# Patient Record
Sex: Female | Born: 1955
Health system: Southern US, Community
[De-identification: ages and names within clinical notes are randomized; demographics above are authoritative.]

## PROBLEM LIST (undated history)

## (undated) DIAGNOSIS — N920 Excessive and frequent menstruation with regular cycle: Secondary | ICD-10-CM

## (undated) DIAGNOSIS — K317 Polyp of stomach and duodenum: Secondary | ICD-10-CM

## (undated) DIAGNOSIS — F32A Depression, unspecified: Secondary | ICD-10-CM

## (undated) DIAGNOSIS — K219 Gastro-esophageal reflux disease without esophagitis: Secondary | ICD-10-CM

## (undated) DIAGNOSIS — N840 Polyp of corpus uteri: Secondary | ICD-10-CM

## (undated) DIAGNOSIS — D219 Benign neoplasm of connective and other soft tissue, unspecified: Secondary | ICD-10-CM

## (undated) DIAGNOSIS — K148 Other diseases of tongue: Secondary | ICD-10-CM

## (undated) DIAGNOSIS — N841 Polyp of cervix uteri: Secondary | ICD-10-CM

## (undated) DIAGNOSIS — D689 Coagulation defect, unspecified: Secondary | ICD-10-CM

## (undated) DIAGNOSIS — F329 Major depressive disorder, single episode, unspecified: Secondary | ICD-10-CM

## (undated) DIAGNOSIS — N809 Endometriosis, unspecified: Secondary | ICD-10-CM

## (undated) DIAGNOSIS — N63 Unspecified lump in unspecified breast: Secondary | ICD-10-CM

## (undated) DIAGNOSIS — D131 Benign neoplasm of stomach: Secondary | ICD-10-CM

## (undated) HISTORY — PX: BREAST BIOPSY: SHX20

## (undated) HISTORY — PX: DILATION AND CURETTAGE OF UTERUS: SHX78

## (undated) HISTORY — DX: Polyp of stomach and duodenum: K31.7

## (undated) HISTORY — DX: Depression, unspecified: F32.A

## (undated) HISTORY — DX: Excessive and frequent menstruation with regular cycle: N92.0

## (undated) HISTORY — DX: Major depressive disorder, single episode, unspecified: F32.9

## (undated) HISTORY — DX: Unspecified lump in unspecified breast: N63.0

## (undated) HISTORY — DX: Gastro-esophageal reflux disease without esophagitis: K21.9

## (undated) HISTORY — DX: Polyp of corpus uteri: N84.0

## (undated) HISTORY — DX: Endometriosis, unspecified: N80.9

## (undated) HISTORY — DX: Benign neoplasm of connective and other soft tissue, unspecified: D21.9

## (undated) HISTORY — DX: Benign neoplasm of stomach: D13.1

## (undated) HISTORY — PX: TUBAL LIGATION: SHX77

## (undated) HISTORY — DX: Coagulation defect, unspecified: D68.9

## (undated) HISTORY — PX: HYSTEROSCOPY: SHX211

## (undated) HISTORY — DX: Polyp of cervix uteri: N84.1

---
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
## 1999-05-05 ENCOUNTER — Other Ambulatory Visit: Admission: RE | Admit: 1999-05-05 | Discharge: 1999-05-05 | Payer: Self-pay | Admitting: Obstetrics and Gynecology

## 1999-09-13 HISTORY — PX: VAGINAL HYSTERECTOMY: SUR661

## 1999-11-25 ENCOUNTER — Other Ambulatory Visit: Admission: RE | Admit: 1999-11-25 | Discharge: 1999-11-25 | Payer: Self-pay | Admitting: Obstetrics and Gynecology

## 2000-01-31 ENCOUNTER — Encounter (INDEPENDENT_AMBULATORY_CARE_PROVIDER_SITE_OTHER): Payer: Self-pay

## 2000-01-31 ENCOUNTER — Inpatient Hospital Stay (HOSPITAL_COMMUNITY): Admission: RE | Admit: 2000-01-31 | Discharge: 2000-02-02 | Payer: Self-pay | Admitting: Obstetrics and Gynecology

## 2000-08-10 ENCOUNTER — Other Ambulatory Visit: Admission: RE | Admit: 2000-08-10 | Discharge: 2000-08-10 | Payer: Self-pay | Admitting: Radiology

## 2002-07-01 ENCOUNTER — Other Ambulatory Visit: Admission: RE | Admit: 2002-07-01 | Discharge: 2002-07-01 | Payer: Self-pay | Admitting: Obstetrics and Gynecology

## 2002-12-18 ENCOUNTER — Encounter: Admission: RE | Admit: 2002-12-18 | Discharge: 2003-03-18 | Payer: Self-pay | Admitting: Family Medicine

## 2004-09-12 HISTORY — PX: OTHER SURGICAL HISTORY: SHX169

## 2005-03-02 ENCOUNTER — Other Ambulatory Visit: Admission: RE | Admit: 2005-03-02 | Discharge: 2005-03-02 | Payer: Self-pay | Admitting: Obstetrics and Gynecology

## 2005-08-10 ENCOUNTER — Ambulatory Visit: Payer: Self-pay | Admitting: Otolaryngology

## 2006-11-16 ENCOUNTER — Other Ambulatory Visit: Admission: RE | Admit: 2006-11-16 | Discharge: 2006-11-16 | Payer: Self-pay | Admitting: Obstetrics and Gynecology

## 2007-11-21 ENCOUNTER — Other Ambulatory Visit: Admission: RE | Admit: 2007-11-21 | Discharge: 2007-11-21 | Payer: Self-pay | Admitting: Obstetrics and Gynecology

## 2008-01-21 ENCOUNTER — Ambulatory Visit: Payer: Self-pay | Admitting: Family Medicine

## 2008-12-18 ENCOUNTER — Encounter: Payer: Self-pay | Admitting: Obstetrics and Gynecology

## 2008-12-18 ENCOUNTER — Other Ambulatory Visit: Admission: RE | Admit: 2008-12-18 | Discharge: 2008-12-18 | Payer: Self-pay | Admitting: Obstetrics and Gynecology

## 2008-12-18 ENCOUNTER — Ambulatory Visit: Payer: Self-pay | Admitting: Obstetrics and Gynecology

## 2010-02-04 ENCOUNTER — Other Ambulatory Visit: Admission: RE | Admit: 2010-02-04 | Discharge: 2010-02-04 | Payer: Self-pay | Admitting: Obstetrics and Gynecology

## 2010-02-04 ENCOUNTER — Ambulatory Visit: Payer: Self-pay | Admitting: Obstetrics and Gynecology

## 2010-03-23 ENCOUNTER — Encounter: Admission: RE | Admit: 2010-03-23 | Discharge: 2010-03-23 | Payer: Self-pay | Admitting: Obstetrics and Gynecology

## 2010-09-17 LAB — HM PAP SMEAR: HM Pap smear: NORMAL

## 2011-01-28 NOTE — H&P (Signed)
[facility]  Patient:    Lauren Callahan, Lauren Callahan                         MRN: [id number] Adm. Date:  01/31/00 Attending:  Rande Brunt. Eda Paschal, M.D.                         History and Physical  CHIEF COMPLAINT:  Dysmenorrhea, menorrhagia, pelvic pain.  HISTORY OF PRESENT ILLNESS:  The patient is a 55 year old, gravida 1, para 1, AB 0, who presented to the office in March, with the persistence of severe dysmenorrhea, menorrhagia, spotting for 12 days.  She has had a persistent left adnexal mass which we have been following on ultrasound, which looks consistent with endometriosis of the left ovary.  She also gets intermittent pelvic pain between her periods.  The problems that are bothering her the most are the heavy periods, pain with her periods, and the intermittent pain between her periods.  She does have small myomas that have also probably contributed to the above.  Previous medical therapy included a D&C which only gave her relief for a short period of time, and oral contraceptives where she has had no relief at all.  As a result of the persistence of her very difficult period, she now enters the hospital for definitive surgery. Definitive surgery is removal of the uterus, but because of the persistent left adnexal mass, which is complex, this needs to be addressed at this time as well.  We have discussed options of addressing it either through a TAH or through a laparoscopic approach, and she has elected to do it laparoscopically to try to save her from having an incision.  We will laparoscope her.  If it is appropriate to proceed laparoscopically combined with a vaginal hysterectomy, we will do so.  If not, she understands the need for laparotomy. She also understands the risk that this could be malignant and the issues involved with that.  She would like me to preserve one of her ovaries because of the issues of hormone replacement, and assuming there is no  malignancy we will do so.  If we do not want to spill the contents of the left ovarian cyst, we may do a left salpingo-oophorectomy or we may just do an ovarian cystectomy on the left pending the above.  PAST MEDICAL HISTORY:  Previous D&C and laparoscopic tubal ligation done in November 1997.  She was also hysteroscoped at the same time.  PRESENT MEDICATIONS:  Nonsteroidal anti-inflammatory drugs.  ALLERGIES:  None.  FAMILY HISTORY:  Father and grandfather are diabetic.  Father is hypertensive, as well as having coronary artery disease.  SOCIAL HISTORY:  She is a user of alcohol socially, but does not smoke.  She does use caffeine.  REVIEW OF SYSTEMS:  HEENT:  Basically negative.  Cardiovascular:  Shortness of breath at rest or exertion.  Respiratory:  Negative.  GI:  History of GERD. GU is negative.  Neuromuscular is negative except for neck stiffness. Endocrine is negative.  PHYSICAL EXAMINATION:  GENERAL:  The patient is a well-developed, well-nourished female in no acute distress.  VITAL SIGNS:  Blood pressure 120/74, pulse 80 and regular, respirations 16 and nonlabored.  She is afebrile.  HEENT:  All within normal limits.  NECK:  Supple.  Trachea in the midline.  Thyroid is not enlarged.  LUNGS:  Clear to P&A.  HEART:  No thrills, heaves, or murmurs.  BREASTS:  No masses.  ABDOMEN:  Soft without guarding, rebound, or masses.  PELVIC:  External and vaginal is within normal limits.  Cervix is clean.  Pap was obtained.  Uterus is enlarged by myomas to 7-8 week size.  Adnexa are palpably normal.  RECTAL:  Negative.  EXTREMITIES:  Within normal limits.  ADMISSION IMPRESSION:  Severe dysmenorrhea, menorrhagia with spotting inbetween.  Pelvic pain.  Persistent left adnexal mass on ultrasound. DD:  01/31/00 TD:  01/31/00 Job: [id number]

## 2011-01-28 NOTE — Op Note (Signed)
[facility]  Patient:    Lauren Callahan, Lauren Callahan                    MRN: [id number] Proc. Date: 01/31/00 Adm. Date:  [id number] Disc. Date: [id number] Attending:  Sharon Mt                           Operative Report  PREOPERATIVE DIAGNOSIS:  Pelvic pain, dysfunctional uterine bleeding, dysmenorrhea, fibroids, left adnexal mass.  POSTOPERATIVE DIAGNOSIS:  Pelvic pain, dysfunctional uterine bleeding, dysmenorrhea, fibroids, left adnexal mass, endometrioma of left fallopian tube and endometriosis of right fallopian tube.  OPERATION PERFORMED:  Laparoscopy with partial left salpingectomy, fulguration of endometriosis of right fallopian tube followed by laparoscopically assisted vaginal hysterectomy.  SURGEON:  Daniel L. Eda Paschal, M.D.  ASSISTANT:  Raynald Kemp, M.D.  ANESTHESIA:  OPERATIVE FINDINGS:  At time of laparoscopy, the patients uterus was enlarged by multiple small myomas.  The left fallopian tube showed an endometrioma in the midportion of the left fallopian tube of about 2 to 3 cm.  The left ovary itself; however, was free of disease.  The right ovary and fallopian tube were completely free of disease except for several endometrial implants on the serosa of the fallopian tube.  The ileocecal junction was identified and the appendix was normal.  Cul-de-sac was free of endometriosis.  There was some serosal endometriosis involving the posterior serosa of the uterus.  DESCRIPTION OF PROCEDURE:  After adequate general endotracheal anesthesia, the patient was placed in dorsal lithotomy position and prepped and draped in the usual sterile manner.  A Foley catheter was inserted in the patients bladder. A Hulka tenaculum was inserted into the uterus.  A pneumoperitoneum was created with the ____________ needle.  Following this, three trocars were placed.  The first was subumbilical with a 10 mm trocar.  Through this, the operating  laparoscope and a camera were placed.  Then two 5 mm ports were placed superpubically, one in the midline, one in the left lower quadrant. First, peritoneal washings were obtained and the pelvis was systematically evaluated.  The left adnexal mass that had been seen preoperatively turned out to be an entometrioma of the left fallopian tube.  After this was ascertained, it was removed using bipolar and scissors to excise it without any bleeding. The area of endometriois in the right fallopian tube was also fulgurated until it was completely gone.  After this, the attachments of the uterus to the adnexa were separated including the utero-ovarian ligament, the round ligament and the fallopian tube with bipolar and cutting, the bladder flap was started. At this point it was felt that the surgery could proceed vaginally.  The patient was repositioned.  A 1:200,000 solution of epinephrine and 0.5% Xylocaine was injected around the cervix.  A 360 degree incision was made around the cervix.  The bladder was mobilized superiorly as was the posterior peritoneum.  The posterior peritoneum and vesicouterine  ____________ peritoneum were entered by sharp dissection.  The uterosacral ligaments were clamped, cut and suture ligated with #1 chromic catgut shortening them and suturing them to the vaginal vault laterally for good support.  Cardinal ligaments were clamped, cut and suture ligated with #1 chromic catgut.  The uterine arteries were clamped, cut and doubly suture ligated with #1 chromic catgut.  The remaining portion of broad ligament which was very limited was clamped, cut and suture ligated with #1 chromic catgut.  The uterus was delivered intact and sent to pathology for tissue diagnosis.  The endometrioma of the tube could not be located at this point.  It had been placed in the cul-de-sac but had moved.  Therefore, the procedure was terminated in the following fashion.  A 0 Vicryl suture was  used to whipstitch the posterior peritoneum to the vaginal cuff 360 degrees around for good hemostasis.  A 2-0 Vicryl modified McCall enterocele suture was placed to eliminate any possible future enterocele.  The vaginal cuff was closed with figure-of-eights of #1 chromic catgut.  The surgeons regloved and went abdominally again.  The pneumoperitoneum was recreated.  The pelvis was copiously irrigated with Ringers lactate and there was no bleeding from any of the pedicles.  The endometrium on the tube was found and was removed through the operating channel.  The pneumoperitoneum was then evacuated.  The subumbilical fascial incision was closed with 0 Vicryl and all skin incisions were closed with 3-0 Monocryl.  Estimated blood loss for this entire procedure was 150 cc with none replaced.  The patient tolerated the procedure well and left the operating room in satisfactory condition draining clear urine from a Foley catheter. DD:  01/31/00 TD:  02/04/00 Job: [id number]

## 2011-01-28 NOTE — Discharge Summary (Signed)
[facility]  Patient:    Lauren Callahan, Lauren Callahan                    MRN: [id number] Adm. Date:  [id number] Disc. Date: [id number] Attending:  Sharon Mt                           Discharge Summary  HISTORY OF PRESENT ILLNESS:  The patient is a 55 year old female who entered the hospital with pelvic pain, dysfunctional uterine bleeding, dysmenorrhea, and fibroids with endometriosis expected, for definitive surgery.  HOSPITAL COURSE:  On the day of admission, she was taken to the operating room.  She was first laparoscoped.  Findings were endometriosis involving her fallopian tubes.  She underwent laparoscopic partial salpingectomy on the left and fulguration of endometriosis on right fallopian tube.  This was followed by laparoscopically assisted hysterectomy.  Postoperatively, she had a slight ileus which responded to Dulcolax suppository and IV fluids.  By the second postoperative day, she was ready for discharge.  DISCHARGE MEDICATIONS: 1. Darvocet-N 100 for pain relief. 2. Dulcolax suppositories and laxatives p.r.n. for ileus.  ACTIVITY:  Regular.  DIET:  Soft to advance as tolerated.  WOUND CARE:  Routine.  FOLLOW-UP:  She is to return to our office in three weeks for further follow-up.  LABORATORY DATA:  The final pathology report revealed tubal endometriosis, uterus with cervical endometriosis, and multiple leiomyoma uteri.  CONDITION ON DISCHARGE:  Improved.  DISCHARGE DIAGNOSES: 1. Dysfunctional uterine bleeding. 2. Dysmenorrhea. 3. Pelvic pain. 4. Endometriosis. 5. Leiomyoma uteri.  OPERATIONS: 1. Laparoscopic partial salpingectomy. 2. Fulguration of endometriosis. 3. Laparoscopically-assisted vaginal hysterectomy. DD:  02/02/00 TD:  02/05/00 Job: [id number]

## 2011-03-09 ENCOUNTER — Encounter (INDEPENDENT_AMBULATORY_CARE_PROVIDER_SITE_OTHER): Payer: BC Managed Care – PPO | Admitting: Obstetrics and Gynecology

## 2011-03-09 ENCOUNTER — Other Ambulatory Visit: Payer: Self-pay | Admitting: Obstetrics and Gynecology

## 2011-03-09 ENCOUNTER — Other Ambulatory Visit (HOSPITAL_COMMUNITY)
Admission: RE | Admit: 2011-03-09 | Discharge: 2011-03-09 | Disposition: A | Discharge status: Home / Self Care | Payer: BC Managed Care – PPO | Source: Ambulatory Visit | Attending: Obstetrics and Gynecology | Admitting: Obstetrics and Gynecology

## 2011-03-09 DIAGNOSIS — Z833 Family history of diabetes mellitus: Secondary | ICD-10-CM

## 2011-03-09 DIAGNOSIS — J029 Acute pharyngitis, unspecified: Secondary | ICD-10-CM

## 2011-03-09 DIAGNOSIS — Z13220 Encounter for screening for lipoid disorders: Secondary | ICD-10-CM

## 2011-03-09 DIAGNOSIS — Z01419 Encounter for gynecological examination (general) (routine) without abnormal findings: Secondary | ICD-10-CM

## 2011-03-09 DIAGNOSIS — Z124 Encounter for screening for malignant neoplasm of cervix: Secondary | ICD-10-CM | POA: Insufficient documentation

## 2011-05-20 ENCOUNTER — Telehealth: Payer: Self-pay

## 2011-05-20 DIAGNOSIS — N390 Urinary tract infection, site not specified: Secondary | ICD-10-CM

## 2011-05-20 MED ORDER — CIPROFLOXACIN HCL 250 MG PO TABS: 250.0000 mg | ORAL_TABLET | Freq: Two times a day (BID) | ORAL | Status: AC

## 2011-05-20 NOTE — Telephone Encounter (Signed)
PT REQUESTING AN ANTIBIOTIC FOR URINARY PRESSURE & RX FOR URINARY SPASMS. STATES YOU HAVE GIVEN RX TO HER BEFORE. I DO NOT SEE IT IN HER CHART. SHE ALSO STATES SHE HAD POSSIBLE "HEART PROBLEMS" WITH THE PREVIOUS ANTIBIOTIC. I TRANSFERRED HER UP TO APPTS. TO BE SEEN DUE TO THIS POSSIBLE LIFE-THREATENING REACTION TO PREVIOUS ANTIBIOTIC. DONNA SENT INFO. BACK TO MY VOICEMAIL THAT PT IS  INSISTING TO ASK YOU FOR RX'S WITHOUT BEING SEEN.

## 2011-05-20 NOTE — Telephone Encounter (Signed)
PT. NOTIFIED OF DR. G'S NOTE BELOW. PT. STATES SHE THINKS IT WAS THE MACRODANTIN SHE HAD THE SEVERE REACTION TO. ORDER IN P.C. RX SENT TO HER PHARMACY.

## 2011-05-20 NOTE — Telephone Encounter (Signed)
If not allergic treat her with Cipro 250 mg twice a day for 7 days. Patient must have followup UA done. Was the antibiotic that she had a reaction to previously Macrodantin?

## 2011-11-09 ENCOUNTER — Other Ambulatory Visit: Payer: Self-pay | Admitting: *Deleted

## 2011-11-09 MED ORDER — ALPRAZOLAM 0.5 MG PO TABS: 0.5000 mg | ORAL_TABLET | Freq: Every evening | ORAL | Status: AC | PRN

## 2011-11-09 NOTE — Telephone Encounter (Signed)
rx called in

## 2012-04-03 ENCOUNTER — Encounter: Payer: Self-pay | Admitting: Obstetrics and Gynecology

## 2012-06-20 ENCOUNTER — Other Ambulatory Visit: Payer: Self-pay | Admitting: Obstetrics and Gynecology

## 2012-06-20 NOTE — Telephone Encounter (Signed)
Patient is overdue for CE since June 2012.  We will contact her to try and schedule.

## 2012-06-20 NOTE — Telephone Encounter (Signed)
rx called into pharmacy

## 2012-08-28 ENCOUNTER — Telehealth: Payer: Self-pay | Admitting: *Deleted

## 2012-08-28 ENCOUNTER — Encounter: Payer: Self-pay | Admitting: Gynecology

## 2012-08-28 DIAGNOSIS — N841 Polyp of cervix uteri: Secondary | ICD-10-CM | POA: Insufficient documentation

## 2012-08-28 DIAGNOSIS — N920 Excessive and frequent menstruation with regular cycle: Secondary | ICD-10-CM | POA: Insufficient documentation

## 2012-08-28 DIAGNOSIS — N809 Endometriosis, unspecified: Secondary | ICD-10-CM | POA: Insufficient documentation

## 2012-08-28 DIAGNOSIS — N63 Unspecified lump in unspecified breast: Secondary | ICD-10-CM | POA: Insufficient documentation

## 2012-08-28 DIAGNOSIS — D219 Benign neoplasm of connective and other soft tissue, unspecified: Secondary | ICD-10-CM | POA: Insufficient documentation

## 2012-08-28 DIAGNOSIS — N840 Polyp of corpus uteri: Secondary | ICD-10-CM | POA: Insufficient documentation

## 2012-08-28 MED ORDER — NITROFURANTOIN MONOHYD MACRO 100 MG PO CAPS: ORAL_CAPSULE | ORAL | Status: DC

## 2012-08-28 NOTE — Telephone Encounter (Signed)
Pt calling c/o uti since Saturday, c/o burning with urination and pressure. Pt declined OV today, asked if you could just give her RX because she is sick and lives in [place]. Pt was given cirpro on telephone encounter 05/20/11. Pt has annual scheduled on 09/06/12. Please advise

## 2012-08-28 NOTE — Telephone Encounter (Signed)
Macrobid twice a day with food for 7 days.

## 2012-08-28 NOTE — Addendum Note (Signed)
Addended by: Aura Camps on: 08/28/2012 11:09 AM   Modules accepted: Orders

## 2012-08-28 NOTE — Telephone Encounter (Signed)
Left message on pt voicemail with below, rx sent

## 2012-09-06 ENCOUNTER — Encounter: Payer: BC Managed Care – PPO | Admitting: Obstetrics and Gynecology

## 2013-01-09 ENCOUNTER — Encounter: Payer: Self-pay | Admitting: Women's Health

## 2013-01-09 ENCOUNTER — Ambulatory Visit (INDEPENDENT_AMBULATORY_CARE_PROVIDER_SITE_OTHER): Payer: BC Managed Care – PPO | Admitting: Women's Health

## 2013-01-09 VITALS — BP 116/84 | Ht 65.0 in | Wt 190.0 lb

## 2013-01-09 DIAGNOSIS — Z01419 Encounter for gynecological examination (general) (routine) without abnormal findings: Secondary | ICD-10-CM

## 2013-01-09 DIAGNOSIS — F32A Depression, unspecified: Secondary | ICD-10-CM

## 2013-01-09 DIAGNOSIS — Z13220 Encounter for screening for lipoid disorders: Secondary | ICD-10-CM

## 2013-01-09 DIAGNOSIS — Z833 Family history of diabetes mellitus: Secondary | ICD-10-CM

## 2013-01-09 DIAGNOSIS — F329 Major depressive disorder, single episode, unspecified: Secondary | ICD-10-CM

## 2013-01-09 LAB — COMPREHENSIVE METABOLIC PANEL
ALT: 29 U/L (ref 0–35)
AST: 24 U/L (ref 0–37)
Albumin: 4.3 g/dL (ref 3.5–5.2)
BUN: 9 mg/dL (ref 6–23)
Calcium: 9.5 mg/dL (ref 8.4–10.5)
Creat: 0.8 mg/dL (ref 0.50–1.10)
Glucose, Bld: 85 mg/dL (ref 70–99)
Sodium: 141 mEq/L (ref 135–145)
Total Protein: 6.7 g/dL (ref 6.0–8.3)

## 2013-01-09 LAB — LIPID PANEL
Cholesterol: 197 mg/dL (ref 0–200)
Total CHOL/HDL Ratio: 3.6 Ratio
Triglycerides: 103 mg/dL (ref <150)

## 2013-01-09 LAB — CBC WITH DIFFERENTIAL/PLATELET
Basophils Absolute: 0.1 10*3/uL (ref 0.0–0.1)
Eosinophils Relative: 2 % (ref 0–5)
Hemoglobin: 14.8 g/dL (ref 12.0–15.0)
Lymphs Abs: 2.4 10*3/uL (ref 0.7–4.0)
MCHC: 33.6 g/dL (ref 30.0–36.0)
MCV: 88.2 fL (ref 78.0–100.0)
Monocytes Relative: 8 % (ref 3–12)
Neutro Abs: 4.5 10*3/uL (ref 1.7–7.7)
Platelets: 332 10*3/uL (ref 150–400)
RBC: 4.99 MIL/uL (ref 3.87–5.11)
WBC: 7.7 10*3/uL (ref 4.0–10.5)

## 2013-01-09 LAB — TSH: TSH: 2.532 u[IU]/mL (ref 0.350–4.500)

## 2013-01-09 MED ORDER — ALPRAZOLAM 0.5 MG PO TABS: ORAL_TABLET | ORAL | Status: DC

## 2013-01-09 MED ORDER — BUPROPION HCL ER (XL) 150 MG PO TB24: 150.0000 mg | ORAL_TABLET | Freq: Every day | ORAL | Status: DC

## 2013-01-09 NOTE — Progress Notes (Signed)
Lauren Callahan Jul 23, 1956 [id number]    History:  The patient presents for annual exam. Frustration with weight, trouble with healthy eating and exercise although she is aware of what changes to make. Feeling depressed and not getting enjoyment out of things that are usually enjoyable to her. Has had extensive counseling in the past, denies need for further counseling, states life is good. Has never taken in antidepressant other than occasional Xanax. She also complains of joint pain and mild swelling in posterior ankles upon waking in morning. TVH with [facility] for fibroids. History of normal Paps and mammograms. DEXA 03/2011 osteopenia T score -1.1 left femoral neck FRAX 6.1%/0.3%. Had improvement in the lumbar spine from DEXA done in 2010. Negative stomach polyp and has followup scheduled next week. Negative colonoscopy 2012.   Past medical history, past surgical history, family history and social history were all reviewed and documented in the EPIC chart. Retired. One biological son (age 27) and two stepchildren (age 45 and 57), all doing okay currently 1 and nursing school, one had drug rehabilitation in the past. Parents diabetics, Father heart disease and HTN.   ROS:  A  ROS was performed and pertinent positives and negatives are included in the history.  Exam:  Filed Vitals:   01/09/13 0956  BP: 116/84    General appearance:  Normal Head/Neck:  Normal, without cervical or supraclavicular adenopathy. Thyroid:  Symmetrical, normal in size, without palpable masses or nodularity. Respiratory  Effort:  Normal  Auscultation:  Clear without wheezing or rhonchi Cardiovascular  Auscultation:  Regular rate, without rubs, murmurs or gallops  Edema/varicosities:  Not grossly evident Abdominal  Soft,nontender, without masses, guarding or rebound.  Liver/spleen:  No organomegaly noted  Hernia:  None appreciated  Skin  Inspection:  Grossly normal  Palpation:  Grossly  normal Neurologic/psychiatric  Orientation:  Normal with appropriate conversation.  Mood/affect:  Normal  Genitourinary    Breasts: Examined lying and sitting.     Right: Without masses, retractions, discharge or axillary adenopathy.     Left: Without masses, retractions, discharge or axillary adenopathy.   Inguinal/mons:  Normal without inguinal adenopathy  External genitalia:  Normal  BUS/Urethra/Skene's glands:  Normal  Bladder:  Normal  Vagina:  Normal  Cervix:  absent Uterus: absent  Adnexa/parametria:     Rt: Without masses or tenderness.   Lt: Without masses or tenderness.  Anus and perineum: Normal  Digital rectal exam: Normal sphincter tone without palpated masses or tenderness  Assessment/Plan:  57 y.o. MWF G1P1 and 2 stepchildren  for annual exam.   Depression Overweight  Osteopenia  Plan:options for depression and reviewed, denies need for further counseling would like to try a medication, but worried about weight gain. Wellbutrin 150 XL by mouth daily/ a.m., reviewed importance of increasing regular exercise as well which will help with mood also. Instructed to call office in a few weeks to a month to report on how well the medicine is working for her. Repeat DEXA in one year. Ordered CMET, TSH, lipid panel, UA. Home Hemoccult card given with instructions.    Harrington Challenger [facility], 10:44 AM 01/09/2013

## 2013-01-09 NOTE — Patient Instructions (Addendum)

## 2013-01-10 LAB — URINALYSIS W MICROSCOPIC + REFLEX CULTURE
Nitrite: NEGATIVE
Urobilinogen, UA: 1 mg/dL (ref 0.0–1.0)

## 2013-01-11 ENCOUNTER — Encounter: Payer: Self-pay | Admitting: Obstetrics and Gynecology

## 2013-02-06 ENCOUNTER — Other Ambulatory Visit: Payer: Self-pay

## 2013-02-06 ENCOUNTER — Telehealth: Payer: Self-pay

## 2013-02-06 DIAGNOSIS — F329 Major depressive disorder, single episode, unspecified: Secondary | ICD-10-CM

## 2013-02-06 MED ORDER — BUPROPION HCL ER (XL) 150 MG PO TB24: 150.0000 mg | ORAL_TABLET | Freq: Every day | ORAL | Status: DC

## 2013-02-06 NOTE — Telephone Encounter (Signed)
I received pharmacy request for 90 day supply of  patient's Wellbutrin. Lauren Callahan had prescribed 6 refills last mos so I went ahead and okayed it.  Upon review of 4/30 office note [place] had noted that patient should call and let her know how the meds are helping after several weeks to one mos on it.  I left patient a message and asked her to call to report per [place] note.

## 2013-04-15 ENCOUNTER — Encounter: Payer: Self-pay | Admitting: Women's Health

## 2013-06-07 ENCOUNTER — Other Ambulatory Visit: Payer: Self-pay | Admitting: Women's Health

## 2013-06-10 NOTE — Telephone Encounter (Signed)
rx called in [facility] 

## 2013-06-26 ENCOUNTER — Other Ambulatory Visit: Payer: Self-pay | Admitting: Women's Health

## 2013-07-18 ENCOUNTER — Other Ambulatory Visit: Payer: Self-pay

## 2013-08-09 ENCOUNTER — Other Ambulatory Visit: Payer: Self-pay | Admitting: Women's Health

## 2013-09-17 LAB — HM COLONOSCOPY

## 2013-10-17 ENCOUNTER — Other Ambulatory Visit: Payer: Self-pay | Admitting: Women's Health

## 2013-10-18 NOTE — Telephone Encounter (Signed)
Called into pharmacy

## 2014-05-09 ENCOUNTER — Encounter: Payer: Self-pay | Admitting: Women's Health

## 2014-05-11 ENCOUNTER — Encounter: Payer: Self-pay | Admitting: Women's Health

## 2014-06-25 ENCOUNTER — Telehealth: Payer: Self-pay

## 2014-06-25 NOTE — Telephone Encounter (Signed)
Overdue for annual, please have her schedule, usually best to stay on med if feeling better but will discuss at annual exam.

## 2014-06-25 NOTE — Telephone Encounter (Signed)
Patient said she has been taking antidepressant for 1 1/2 years and is not ready to d/c it as she is feeling much better and no longer wants to be on it.  She was calling to ask how to d/c it but then mentioned she has not taken it in four days.

## 2014-06-26 NOTE — Telephone Encounter (Signed)
Left detailed message on patient's voice mail advising per [place]'s note below.

## 2014-07-14 ENCOUNTER — Encounter: Payer: Self-pay | Admitting: Women's Health

## 2014-07-18 LAB — HM MAMMOGRAPHY: HM MAMMO: NORMAL

## 2014-07-24 ENCOUNTER — Other Ambulatory Visit: Payer: Self-pay

## 2014-07-25 ENCOUNTER — Other Ambulatory Visit: Payer: Self-pay

## 2014-07-29 ENCOUNTER — Telehealth: Payer: Self-pay | Admitting: *Deleted

## 2014-07-29 MED ORDER — BUPROPION HCL ER (XL) 150 MG PO TB24: 150.0000 mg | ORAL_TABLET | Freq: Every day | ORAL | Status: DC

## 2014-07-29 NOTE — Telephone Encounter (Signed)
Pt has annual schedule on 08/26/14 requesting refill on Wellbutrin 150 XL. Okay to refill?

## 2014-07-29 NOTE — Telephone Encounter (Signed)
Ok to refill 

## 2014-07-29 NOTE — Telephone Encounter (Signed)
Rx sent, pt informed. 

## 2014-08-26 ENCOUNTER — Encounter: Payer: BC Managed Care – PPO | Admitting: Women's Health

## 2014-09-08 ENCOUNTER — Other Ambulatory Visit: Payer: Self-pay

## 2014-09-08 MED ORDER — BUPROPION HCL ER (XL) 150 MG PO TB24: 150.0000 mg | ORAL_TABLET | Freq: Every day | ORAL | Status: DC

## 2014-09-08 NOTE — Telephone Encounter (Signed)
I spoke with patient and stressed importance of keeping CE appt that is scheduled. Refill sent.

## 2014-09-08 NOTE — Telephone Encounter (Signed)
Patient overdue for CE. Last one 01/09/13.  Has CE scheduled 10/08/14.

## 2014-09-08 NOTE — Telephone Encounter (Signed)
[place] for refill for 1 month only BUT must make/keep annual exam appt.

## 2014-09-17 ENCOUNTER — Encounter: Payer: Self-pay | Admitting: Internal Medicine

## 2014-09-17 ENCOUNTER — Ambulatory Visit (INDEPENDENT_AMBULATORY_CARE_PROVIDER_SITE_OTHER): Payer: BLUE CROSS/BLUE SHIELD | Admitting: Internal Medicine

## 2014-09-17 VITALS — BP 116/72 | HR 77 | Temp 98.1°F | Ht 64.75 in | Wt 194.0 lb

## 2014-09-17 DIAGNOSIS — E669 Obesity, unspecified: Secondary | ICD-10-CM | POA: Insufficient documentation

## 2014-09-17 DIAGNOSIS — F4323 Adjustment disorder with mixed anxiety and depressed mood: Secondary | ICD-10-CM

## 2014-09-17 DIAGNOSIS — R079 Chest pain, unspecified: Secondary | ICD-10-CM

## 2014-09-17 DIAGNOSIS — Z0000 Encounter for general adult medical examination without abnormal findings: Secondary | ICD-10-CM

## 2014-09-17 LAB — LIPID PANEL
CHOL/HDL RATIO: 4
Cholesterol: 203 mg/dL — ABNORMAL HIGH (ref 0–200)
HDL: 54.8 mg/dL (ref >39.00)
LDL CALC: 126 mg/dL — AB (ref 0–99)
NonHDL: 148.2
TRIGLYCERIDES: 110 mg/dL (ref 0.0–149.0)
VLDL: 22 mg/dL (ref 0.0–40.0)

## 2014-09-17 LAB — CBC WITH DIFFERENTIAL/PLATELET
BASOS PCT: 1.3 % (ref 0.0–3.0)
Basophils Absolute: 0.1 10*3/uL (ref 0.0–0.1)
Eosinophils Absolute: 0.1 10*3/uL (ref 0.0–0.7)
Eosinophils Relative: 1.8 % (ref 0.0–5.0)
HCT: 42.3 % (ref 36.0–46.0)
Hemoglobin: 13.9 g/dL (ref 12.0–15.0)
LYMPHS ABS: 2.2 10*3/uL (ref 0.7–4.0)
LYMPHS PCT: 27.5 % (ref 12.0–46.0)
MCHC: 32.8 g/dL (ref 30.0–36.0)
MCV: 93.1 fl (ref 78.0–100.0)
Monocytes Absolute: 0.4 10*3/uL (ref 0.1–1.0)
Monocytes Relative: 5.1 % (ref 3.0–12.0)
Neutro Abs: 5 10*3/uL (ref 1.4–7.7)
Neutrophils Relative %: 64.3 % (ref 43.0–77.0)
PLATELETS: 293 10*3/uL (ref 150.0–400.0)
RBC: 4.54 Mil/uL (ref 3.87–5.11)
RDW: 13.9 % (ref 11.5–15.5)
WBC: 7.8 10*3/uL (ref 4.0–10.5)

## 2014-09-17 LAB — COMPREHENSIVE METABOLIC PANEL
ALK PHOS: 66 U/L (ref 39–117)
ALT: 22 U/L (ref 0–35)
AST: 18 U/L (ref 0–37)
Albumin: 4.1 g/dL (ref 3.5–5.2)
BUN: 12 mg/dL (ref 6–23)
CALCIUM: 9.1 mg/dL (ref 8.4–10.5)
CHLORIDE: 108 meq/L (ref 96–112)
CO2: 27 mEq/L (ref 19–32)
CREATININE: 0.7 mg/dL (ref 0.4–1.2)
GFR: 90.63 mL/min (ref >60.00)
Glucose, Bld: 83 mg/dL (ref 70–99)
Potassium: 4.7 mEq/L (ref 3.5–5.1)
SODIUM: 142 meq/L (ref 135–145)
TOTAL PROTEIN: 6.6 g/dL (ref 6.0–8.3)
Total Bilirubin: 0.8 mg/dL (ref 0.2–1.2)

## 2014-09-17 LAB — MICROALBUMIN / CREATININE URINE RATIO
CREATININE, U: 143.5 mg/dL
MICROALB UR: 2.6 mg/dL — AB (ref 0.0–1.9)
Microalb Creat Ratio: 1.8 mg/g (ref 0.0–30.0)

## 2014-09-17 LAB — HEMOGLOBIN A1C: Hgb A1c MFr Bld: 5.9 % (ref 4.6–6.5)

## 2014-09-17 LAB — TSH: TSH: 2.04 u[IU]/mL (ref 0.35–4.50)

## 2014-09-17 LAB — VITAMIN D 25 HYDROXY (VIT D DEFICIENCY, FRACTURES): VITD: 16.1 ng/mL — AB (ref 30.00–100.00)

## 2014-09-17 MED ORDER — ALPRAZOLAM 0.5 MG PO TABS: 0.5000 mg | ORAL_TABLET | Freq: Two times a day (BID) | ORAL | Status: DC | PRN

## 2014-09-17 MED ORDER — BUPROPION HCL ER (XL) 300 MG PO TB24: 300.0000 mg | ORAL_TABLET | Freq: Every day | ORAL | Status: DC

## 2014-09-17 NOTE — Progress Notes (Signed)
Subjective:    Patient ID: Lauren Callahan, female    DOB: 1955-09-22, 59 y.o.   MRN: [id number]  HPI 58YO female presents to establish care.  Generally has been healthy. Struggled with weight issues her entire life. Not currently following any diet or exercise program.  Has history of gastric polyps, one which was dysplastic in 2013. Had repeat endoscopy and removed 9 polyps which were benign.  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 Has some residual lisp from this.  Started on Wellbutrin last year because of depressed mood in setting of struggling with weight. Symptoms have improved. Would like to get off the medication.  Wt Readings from Last 3 Encounters:  09/17/14 194 lb (87.998 kg)  01/09/13 190 lb (86.183 kg)   She is also concerned about recent left sided aching chest pain. This comes and goes both at rest. No other symptoms such as dyspnea, diaphoresis, nausea. No previous known h/o CAD, however father has CAD.  Past medical, surgical, family and social history per today's encounter.  Review of Systems  Constitutional: Negative for fever, chills, appetite change, fatigue and unexpected weight change.  Eyes: Negative for visual disturbance.  Respiratory: Negative for shortness of breath.   Cardiovascular: Positive for chest pain. Negative for palpitations and leg swelling.  Gastrointestinal: Negative for nausea, vomiting, abdominal pain, diarrhea, constipation and abdominal distention.  Musculoskeletal: Negative for myalgias and arthralgias.  Skin: Negative for color change and rash.  Hematological: Negative for adenopathy. Does not bruise/bleed easily.  Psychiatric/Behavioral: Negative for suicidal ideas, sleep disturbance and dysphoric mood. The patient is not nervous/anxious.        Objective:    BP 116/72 mmHg  Pulse 77  Temp(Src) 98.1 F (36.7 C) (Oral)  Ht 5' 4.75" (1.645 m)  Wt 194 lb (87.998 kg)  BMI 32.52 kg/m2  SpO2 98% Physical  Exam  Constitutional: She is oriented to person, place, and time. She appears well-developed and well-nourished. No distress.  HENT:  Head: Normocephalic and atraumatic.  Right Ear: External ear normal.  Left Ear: External ear normal.  Nose: Nose normal.  Mouth/Throat: Oropharynx is clear and moist. No oropharyngeal exudate.  Eyes: Conjunctivae and EOM are normal. Pupils are equal, round, and reactive to light. Right eye exhibits no discharge.  Neck: Normal range of motion. Neck supple. No thyromegaly present.  Cardiovascular: Normal rate, regular rhythm, normal heart sounds and intact distal pulses.  Exam reveals no gallop and no friction rub.   No murmur heard. Pulmonary/Chest: Effort normal. No respiratory distress. She has no wheezes. She has no rales.  Musculoskeletal: Normal range of motion. She exhibits no edema or tenderness.  Lymphadenopathy:    She has no cervical adenopathy.  Neurological: She is alert and oriented to person, place, and time. No cranial nerve deficit. Coordination normal.  Skin: Skin is warm and dry. No rash noted. She is not diaphoretic. No erythema. No pallor.  Psychiatric: She has a normal mood and affect. Her behavior is normal. Judgment and thought content normal.          Assessment & Plan:   Problem List Items Addressed This Visit      Unprioritized   Adjustment disorder with mixed anxiety and depressed mood - Primary    Previously treated with Wellbutrin for mild anxiety/depression. Symptoms have improved, however will continue Wellbutrin to help with appetite suppression as noted.    Left sided chest pain    Left sided chest pain noted  by pt. Symptoms are atypical for CAD, however she has strong family history of CAD in her father and reports lipids elevated in past. Will check lipids today. EKG today shows no acute findings. Will set up cardiology evaluation for possible stress test.    Relevant Orders      EKG 12-Lead (Completed)       Ambulatory referral to Cardiology   Obesity (BMI 30-39.9)    Wt Readings from Last 3 Encounters:  09/17/14 194 lb (87.998 kg)  01/09/13 190 lb (86.183 kg)   Body mass index is 32.52 kg/(m^2). Encouraged Mediterranean style diet and exercise with goal of 18min 3x per week. We discussed options of medications for appetite suppression. Will try increase in dose of Wellbutrin to help with appetite. Follow up in 4 weeks.     Other Visit Diagnoses    Routine general medical examination at a health care facility        Relevant Orders       CBC with Differential       Comprehensive metabolic panel       Lipid panel       Vit D  25 hydroxy (rtn osteoporosis monitoring)       Microalbumin / creatinine urine ratio       TSH       Hemoglobin A1c        Return in about 4 weeks (around 10/15/2014) for Physical.

## 2014-09-17 NOTE — Progress Notes (Signed)
Pre visit review using our clinic review tool, if applicable. No additional management support is needed unless otherwise documented below in the visit note. 

## 2014-09-17 NOTE — Patient Instructions (Addendum)
Consider reading The Fat Trap from the [place] approx 2011.  Increase Wellbutrin 300mg  daily daily.  Follow up in 4 weeks.

## 2014-09-17 NOTE — Assessment & Plan Note (Signed)
Left sided chest pain noted by pt. Symptoms are atypical for CAD, however she has strong family history of CAD in her father and reports lipids elevated in past. Will check lipids today. EKG today shows no acute findings. Will set up cardiology evaluation for possible stress test.

## 2014-09-17 NOTE — Assessment & Plan Note (Signed)
Wt Readings from Last 3 Encounters:  09/17/14 194 lb (87.998 kg)  01/09/13 190 lb (86.183 kg)   Body mass index is 32.52 kg/(m^2). Encouraged Mediterranean style diet and exercise with goal of 36min 3x per week. We discussed options of medications for appetite suppression. Will try increase in dose of Wellbutrin to help with appetite. Follow up in 4 weeks.

## 2014-09-17 NOTE — Assessment & Plan Note (Signed)
Previously treated with Wellbutrin for mild anxiety/depression. Symptoms have improved, however will continue Wellbutrin to help with appetite suppression as noted.

## 2014-09-18 ENCOUNTER — Encounter: Payer: Self-pay | Admitting: Women's Health

## 2014-09-18 ENCOUNTER — Encounter: Payer: Self-pay | Admitting: *Deleted

## 2014-09-22 ENCOUNTER — Other Ambulatory Visit: Payer: Self-pay | Admitting: Internal Medicine

## 2014-09-23 ENCOUNTER — Ambulatory Visit (INDEPENDENT_AMBULATORY_CARE_PROVIDER_SITE_OTHER): Payer: BLUE CROSS/BLUE SHIELD | Admitting: Cardiovascular Disease

## 2014-09-23 ENCOUNTER — Encounter: Payer: Self-pay | Admitting: Cardiovascular Disease

## 2014-09-23 VITALS — BP 140/92 | HR 81 | Ht 64.5 in | Wt 194.0 lb

## 2014-09-23 DIAGNOSIS — R079 Chest pain, unspecified: Secondary | ICD-10-CM

## 2014-09-23 DIAGNOSIS — R0789 Other chest pain: Secondary | ICD-10-CM

## 2014-09-23 NOTE — Telephone Encounter (Signed)
Last OV 1.6.16.  Please advise refill

## 2014-09-23 NOTE — Progress Notes (Signed)
Primary care physician: Dr. Gilford Rile.  HPI  This is a pleasant 59 year old female who was referred by Dr. Gilford Rile for evaluation of chest pain. She has no previous cardiac history. She has known history of mild hyperlipidemia, obesity and family history of coronary artery disease. She reports recent episodes of left-sided chest pain described as burning sensation which can be continuous for hours. It usually happens at rest and does not get worse with physical activities. She denies any shortness of breath. No PND or lower extremity edema. She denies palpitations, syncope or presyncope. She had a recent EKG which showed poor R-wave progression in the precordial leads. She is not a smoker. Her father had massive myocardial infarction at the age of 53.  Allergies  Allergen Reactions  . Macrodantin [Nitrofurantoin Macrocrystal] Itching and Rash     Current Outpatient Prescriptions on File Prior to Visit  Medication Sig Dispense Refill  . ALPRAZolam (XANAX) 0.5 MG tablet TAKE 1 TABLET BY MOUTH EVERY EVENING AS NEEDED 30 tablet 3  . buPROPion (WELLBUTRIN XL) 300 MG 24 hr tablet Take 1 tablet (300 mg total) by mouth daily. 30 tablet 3  . omeprazole (PRILOSEC) 20 MG capsule Take 20 mg by mouth daily.     No current facility-administered medications on file prior to visit.     Past Medical History  Diagnosis Date  . Endometrial polyp   . Endocervical polyp   . Menorrhagia   . Fibroid   . Endometriosis   . Breast mass   . Benign fundic gland polyps of stomach   . Depression   . GERD (gastroesophageal reflux disease)   . Gastric polyp     Dr. Tiffany Kocher  . Clotting disorder     left leg     Past Surgical History  Procedure Laterality Date  . Dilation and curettage of uterus    . Hysteroscopy    . Tubal ligation    . Vaginal hysterectomy  2001    [facility]  Left Salpingectomy  . Tongue tumor  2006    Benign, Granular tumor, Dr. Carlis Abbott     Family History  Problem Relation Age of Onset    . Diabetes Mother   . Diabetes Father   . Hypertension Father   . Heart disease Father     heart attacks   . Heart attack Father   . Diabetes Maternal Grandfather      History   Social History  . Marital Status: Married    Spouse Name: N/A    Number of Children: N/A  . Years of Education: N/A   Occupational History  . Not on file.   Social History Main Topics  . Smoking status: Never Smoker   . Smokeless tobacco: Not on file  . Alcohol Use: Yes     Comment: occassionally  . Drug Use: No  . Sexual Activity: Yes    Birth Control/ Protection: Surgical   Other Topics Concern  . Not on file   Social History Narrative   Lives in [place] with husband.      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 It is negative other than that mentioned in the history of present illness.   PHYSICAL EXAM   BP 140/92 mmHg  Pulse 81  Ht 5' 4.5" (1.638 m)  Wt 194 lb (87.998  kg)  BMI 32.80 kg/m2 Constitutional: She is oriented to person, place, and time. She appears well-developed and well-nourished. No distress.  HENT: No nasal discharge.  Head: Normocephalic and atraumatic.  Eyes: Pupils are equal and round. No discharge.  Neck: Normal range of motion. Neck supple. No JVD present. No thyromegaly present.  Cardiovascular: Normal rate, regular rhythm, normal heart sounds. Exam reveals no gallop and no friction rub. No murmur heard.  Pulmonary/Chest: Effort normal and breath sounds normal. No stridor. No respiratory distress. She has no wheezes. She has no rales. She exhibits no tenderness.  Abdominal: Soft. Bowel sounds are normal. She exhibits no distension. There is no tenderness. There is no rebound and no guarding.  Musculoskeletal: Normal range of motion. She exhibits no edema and no tenderness.  Neurological: She is alert and oriented to person, place, and time.  Coordination normal.  Skin: Skin is warm and dry. No rash noted. She is not diaphoretic. No erythema. No pallor.  Psychiatric: She has a normal mood and affect. Her behavior is normal. Judgment and thought content normal.     XMD:YJWLK  Rhythm  Low voltage -  ABNORMAL    ASSESSMENT AND PLAN

## 2014-09-23 NOTE — Telephone Encounter (Signed)
Directions different that 09/17/14 Rx for bid, continue with bid directions? (Looks like script was marked as phone in and not called/faxed to pharmacy)

## 2014-09-23 NOTE — Assessment & Plan Note (Signed)
The chest pain is overall atypical and could be due to GERD. However, she has risk factors for coronary artery disease including mild hyperlipidemia, obesity and family history of coronary artery disease. Thus, I recommend evaluation with a stress echo. Her ECG is slightly abnormal with poor R-wave progression in the anterior leads which is usually not a specific finding. I discussed with her the importance of lifestyle changes and weight loss.

## 2014-09-23 NOTE — Patient Instructions (Signed)
Your physician has requested that you have a stress echocardiogram. For further information please visit [email or web address]. Please follow instruction sheet as given. -Eat a lite breakfast  -wear walking shoes and comfortable gym cloths  -you can take your medications  Your physician recommends that you schedule a follow-up appointment in:  As needed

## 2014-10-08 ENCOUNTER — Encounter: Payer: BC Managed Care – PPO | Admitting: Women's Health

## 2014-10-20 ENCOUNTER — Other Ambulatory Visit: Payer: BLUE CROSS/BLUE SHIELD

## 2014-10-22 ENCOUNTER — Ambulatory Visit (INDEPENDENT_AMBULATORY_CARE_PROVIDER_SITE_OTHER): Payer: BLUE CROSS/BLUE SHIELD | Admitting: Internal Medicine

## 2014-10-22 ENCOUNTER — Encounter: Payer: Self-pay | Admitting: Internal Medicine

## 2014-10-22 VITALS — BP 123/78 | HR 77 | Temp 97.8°F | Ht 64.5 in | Wt 197.1 lb

## 2014-10-22 DIAGNOSIS — E669 Obesity, unspecified: Secondary | ICD-10-CM

## 2014-10-22 DIAGNOSIS — Z0000 Encounter for general adult medical examination without abnormal findings: Secondary | ICD-10-CM | POA: Insufficient documentation

## 2014-10-22 DIAGNOSIS — Z23 Encounter for immunization: Secondary | ICD-10-CM

## 2014-10-22 DIAGNOSIS — F4323 Adjustment disorder with mixed anxiety and depressed mood: Secondary | ICD-10-CM

## 2014-10-22 NOTE — Progress Notes (Signed)
Subjective:    Patient ID: Lauren Callahan, female    DOB: [birth year], 59 y.o.   MRN: [id number]  HPI 58YO female presents for annual exam.  Last seen 09/17/2014. Started on Wellbutrin higher dose to help with appetite. Wt Readings from Last 3 Encounters:  10/22/14 197 lb 2 oz (89.415 kg)  09/23/14 194 lb (87.998 kg)  09/17/14 194 lb (87.998 kg)   Notes feeling more down lately. Working to find purpose in retirement. Feels that Wellbutrin has been helpful. Also trying to be more active to help improve symptoms.  Past medical, surgical, family and social history per today's encounter.  Review of Systems  Constitutional: Negative for fever, chills, appetite change, fatigue and unexpected weight change.  Eyes: Negative for visual disturbance.  Respiratory: Negative for shortness of breath.   Cardiovascular: Negative for chest pain and leg swelling.  Gastrointestinal: Negative for nausea, vomiting, abdominal pain, diarrhea, constipation and blood in stool.  Musculoskeletal: Negative for myalgias and arthralgias.  Skin: Negative for color change and rash.  Hematological: Negative for adenopathy. Does not bruise/bleed easily.  Psychiatric/Behavioral: Positive for dysphoric mood. Negative for suicidal ideas and sleep disturbance. The patient is not nervous/anxious.        Objective:    BP 123/78 mmHg  Pulse 77  Temp(Src) 97.8 F (36.6 C) (Oral)  Ht 5' 4.5" (1.638 m)  Wt 197 lb 2 oz (89.415 kg)  BMI 33.33 kg/m2  SpO2 98% Physical Exam  Constitutional: She is oriented to person, place, and time. She appears well-developed and well-nourished. No distress.  HENT:  Head: Normocephalic and atraumatic.  Right Ear: External ear normal.  Left Ear: External ear normal.  Nose: Nose normal.  Mouth/Throat: Oropharynx is clear and moist. No oropharyngeal exudate.  Eyes: Conjunctivae are normal. Pupils are equal, round, and reactive to light. Right eye exhibits no discharge. Left eye exhibits  no discharge. No scleral icterus.  Neck: Normal range of motion. Neck supple. No tracheal deviation present. No thyromegaly present.  Cardiovascular: Normal rate, regular rhythm, normal heart sounds and intact distal pulses.  Exam reveals no gallop and no friction rub.   No murmur heard. Pulmonary/Chest: Effort normal and breath sounds normal. No accessory muscle usage. No tachypnea. No respiratory distress. She has no decreased breath sounds. She has no wheezes. She has no rales. She exhibits no tenderness. Right breast exhibits no inverted nipple, no mass, no nipple discharge, no skin change and no tenderness. Left breast exhibits no inverted nipple, no mass, no nipple discharge, no skin change and no tenderness. Breasts are symmetrical.  Abdominal: Soft. Bowel sounds are normal. She exhibits no distension and no mass. There is no tenderness. There is no rebound and no guarding.  Musculoskeletal: Normal range of motion. She exhibits no edema or tenderness.  Lymphadenopathy:    She has no cervical adenopathy.  Neurological: She is alert and oriented to person, place, and time. No cranial nerve deficit. She exhibits normal muscle tone. Coordination normal.  Skin: Skin is warm and dry. No rash noted. She is not diaphoretic. No erythema. No pallor.  Psychiatric: She has a normal mood and affect. Her behavior is normal. Judgment and thought content normal.          Assessment & Plan:   Problem List Items Addressed This Visit      Unprioritized   Adjustment disorder with mixed anxiety and depressed mood    Symptoms improved some with increase in Wellbutrin. Will continue. Discussed possible referral for counseling.  She declines for now.      Obesity (BMI 30-39.9)    Wt Readings from Last 3 Encounters:  10/22/14 197 lb 2 oz (89.415 kg)  09/23/14 194 lb (87.998 kg)  09/17/14 194 lb (87.998 kg)   Body mass index is 33.33 kg/(m^2). Encouraged healthy diet and exercise. Will continue  Wellbutrin to help with appetite.      Routine general medical examination at a health care facility - Primary    General medical exam normal today including breast exam. PAP and pelvic deferred as pt s/p complete hysterectomy. Mammogram UTD. Colonoscopy UTD. Tdap and Flu vaccines given today. Encouraged healthy diet and exercise. Reviewed recent labs.Follow up 6 months and prn.          Return in about 6 months (around 04/22/2015) for Recheck.

## 2014-10-22 NOTE — Patient Instructions (Signed)

## 2014-10-22 NOTE — Addendum Note (Signed)
Addended by: Vernetta Honey on: 10/22/2014 02:09 PM   Modules accepted: Orders

## 2014-10-22 NOTE — Assessment & Plan Note (Signed)
General medical exam normal today including breast exam. PAP and pelvic deferred as pt s/p complete hysterectomy. Mammogram UTD. Colonoscopy UTD. Tdap and Flu vaccines given today. Encouraged healthy diet and exercise. Reviewed recent labs.Follow up 6 months and prn.

## 2014-10-22 NOTE — Progress Notes (Signed)
Pre visit review using our clinic review tool, if applicable. No additional management support is needed unless otherwise documented below in the visit note. 

## 2014-10-22 NOTE — Assessment & Plan Note (Signed)
Symptoms improved some with increase in Wellbutrin. Will continue. Discussed possible referral for counseling. She declines for now.

## 2014-10-22 NOTE — Assessment & Plan Note (Signed)
Wt Readings from Last 3 Encounters:  10/22/14 197 lb 2 oz (89.415 kg)  09/23/14 194 lb (87.998 kg)  09/17/14 194 lb (87.998 kg)   Body mass index is 33.33 kg/(m^2). Encouraged healthy diet and exercise. Will continue Wellbutrin to help with appetite.

## 2014-11-03 ENCOUNTER — Other Ambulatory Visit: Payer: BLUE CROSS/BLUE SHIELD

## 2014-11-10 ENCOUNTER — Other Ambulatory Visit: Payer: BLUE CROSS/BLUE SHIELD

## 2014-12-01 ENCOUNTER — Other Ambulatory Visit: Payer: BLUE CROSS/BLUE SHIELD

## 2014-12-08 ENCOUNTER — Other Ambulatory Visit (INDEPENDENT_AMBULATORY_CARE_PROVIDER_SITE_OTHER): Payer: BLUE CROSS/BLUE SHIELD | Admitting: Cardiology

## 2014-12-08 DIAGNOSIS — R079 Chest pain, unspecified: Secondary | ICD-10-CM | POA: Diagnosis not present

## 2014-12-08 DIAGNOSIS — R0789 Other chest pain: Secondary | ICD-10-CM | POA: Diagnosis not present

## 2015-01-13 ENCOUNTER — Other Ambulatory Visit: Payer: Self-pay | Admitting: Internal Medicine

## 2015-03-04 ENCOUNTER — Other Ambulatory Visit: Payer: Self-pay | Admitting: Internal Medicine

## 2015-03-04 NOTE — Telephone Encounter (Signed)
Last OV 2.10.16, no OV scheduled. Please advise refill.

## 2015-03-05 ENCOUNTER — Other Ambulatory Visit: Payer: Self-pay | Admitting: Internal Medicine

## 2015-03-05 NOTE — Telephone Encounter (Signed)
Different dose than med list. Ok refill?

## 2015-03-05 NOTE — Telephone Encounter (Signed)
Rx sent to pharmacy   

## 2015-06-15 ENCOUNTER — Other Ambulatory Visit: Payer: Self-pay

## 2015-06-15 ENCOUNTER — Other Ambulatory Visit: Payer: Self-pay | Admitting: Internal Medicine

## 2015-06-15 ENCOUNTER — Telehealth: Payer: Self-pay

## 2015-06-15 MED ORDER — ALPRAZOLAM 0.5 MG PO TABS: 0.5000 mg | ORAL_TABLET | Freq: Every evening | ORAL | Status: DC | PRN

## 2015-06-15 NOTE — Telephone Encounter (Signed)
Fine to refill x 1 month only

## 2015-06-15 NOTE — Telephone Encounter (Signed)
Received a refill request from [facility]. He states that the patient called requesting a refill on her Alprazolam. Patient states that she is going out of town, and needs this medication refilled today. Please advise.

## 2015-07-14 LAB — HM MAMMOGRAPHY

## 2015-08-18 ENCOUNTER — Telehealth: Payer: Self-pay | Admitting: *Deleted

## 2015-08-18 ENCOUNTER — Other Ambulatory Visit: Payer: Self-pay | Admitting: Internal Medicine

## 2015-08-18 NOTE — Telephone Encounter (Signed)
Fine to fill. Must have controlled substance contract on file and recent UDS

## 2015-08-18 NOTE — Telephone Encounter (Signed)
Patient called to follow up, please advise.  

## 2015-08-18 NOTE — Telephone Encounter (Signed)
Patient has requested refill for Xanax. Please Advise

## 2015-08-19 NOTE — Telephone Encounter (Signed)
ATTEMPTED TO CALL PATIENT BUT NO ANSWER

## 2015-08-20 ENCOUNTER — Telehealth: Payer: Self-pay | Admitting: Internal Medicine

## 2015-08-20 NOTE — Telephone Encounter (Signed)
Patient called the triage line asking about a refill on a medication was not clear what medication she needed. i have tried calling her back and left a voicemail asking for her to callback and clarify which medication she needs.

## 2015-10-13 ENCOUNTER — Encounter: Payer: Self-pay | Admitting: *Deleted

## 2015-10-19 ENCOUNTER — Other Ambulatory Visit: Payer: Self-pay | Admitting: Internal Medicine

## 2015-10-29 ENCOUNTER — Other Ambulatory Visit: Payer: Self-pay | Admitting: Internal Medicine

## 2015-10-29 NOTE — Telephone Encounter (Signed)
Pt is requesting a refill on xanax. Pt's last OV was 11/10/14, last filled 08/20/15 #30tabs with 0refills. Please advise, thanks

## 2015-11-06 ENCOUNTER — Ambulatory Visit
Admission: RE | Admit: 2015-11-06 | Discharge: 2015-11-06 | Disposition: A | Discharge status: Home / Self Care | Payer: BLUE CROSS/BLUE SHIELD | Source: Ambulatory Visit | Attending: Internal Medicine | Admitting: Internal Medicine

## 2015-11-06 ENCOUNTER — Ambulatory Visit (INDEPENDENT_AMBULATORY_CARE_PROVIDER_SITE_OTHER): Payer: BLUE CROSS/BLUE SHIELD | Admitting: Internal Medicine

## 2015-11-06 ENCOUNTER — Encounter: Payer: Self-pay | Admitting: Internal Medicine

## 2015-11-06 VITALS — BP 130/84 | HR 78 | Temp 97.9°F | Ht 64.5 in | Wt 206.5 lb

## 2015-11-06 DIAGNOSIS — E669 Obesity, unspecified: Secondary | ICD-10-CM | POA: Diagnosis not present

## 2015-11-06 DIAGNOSIS — M25562 Pain in left knee: Principal | ICD-10-CM

## 2015-11-06 DIAGNOSIS — M25561 Pain in right knee: Secondary | ICD-10-CM | POA: Diagnosis not present

## 2015-11-06 DIAGNOSIS — K148 Other diseases of tongue: Secondary | ICD-10-CM

## 2015-11-06 DIAGNOSIS — Z0000 Encounter for general adult medical examination without abnormal findings: Secondary | ICD-10-CM

## 2015-11-06 DIAGNOSIS — Z23 Encounter for immunization: Secondary | ICD-10-CM

## 2015-11-06 HISTORY — DX: Other diseases of tongue: K14.8

## 2015-11-06 LAB — LIPID PANEL
CHOLESTEROL: 199 mg/dL (ref 0–200)
HDL: 54 mg/dL (ref >39.00)
LDL CALC: 110 mg/dL — AB (ref 0–99)
NonHDL: 145.44
TRIGLYCERIDES: 176 mg/dL — AB (ref 0.0–149.0)
Total CHOL/HDL Ratio: 4
VLDL: 35.2 mg/dL (ref 0.0–40.0)

## 2015-11-06 LAB — CBC WITH DIFFERENTIAL/PLATELET
BASOS PCT: 0.6 % (ref 0.0–3.0)
Basophils Absolute: 0 10*3/uL (ref 0.0–0.1)
EOS PCT: 2.6 % (ref 0.0–5.0)
Eosinophils Absolute: 0.2 10*3/uL (ref 0.0–0.7)
HEMATOCRIT: 42.9 % (ref 36.0–46.0)
HEMOGLOBIN: 14.3 g/dL (ref 12.0–15.0)
LYMPHS PCT: 31.9 % (ref 12.0–46.0)
Lymphs Abs: 2.4 10*3/uL (ref 0.7–4.0)
MCHC: 33.3 g/dL (ref 30.0–36.0)
MCV: 91 fl (ref 78.0–100.0)
Monocytes Absolute: 0.3 10*3/uL (ref 0.1–1.0)
Monocytes Relative: 4.2 % (ref 3.0–12.0)
NEUTROS ABS: 4.6 10*3/uL (ref 1.4–7.7)
Neutrophils Relative %: 60.7 % (ref 43.0–77.0)
PLATELETS: 347 10*3/uL (ref 150.0–400.0)
RBC: 4.71 Mil/uL (ref 3.87–5.11)
RDW: 14 % (ref 11.5–15.5)
WBC: 7.7 10*3/uL (ref 4.0–10.5)

## 2015-11-06 LAB — COMPREHENSIVE METABOLIC PANEL
ALBUMIN: 4.5 g/dL (ref 3.5–5.2)
ALT: 47 U/L — ABNORMAL HIGH (ref 0–35)
AST: 33 U/L (ref 0–37)
Alkaline Phosphatase: 72 U/L (ref 39–117)
BUN: 11 mg/dL (ref 6–23)
CALCIUM: 9.4 mg/dL (ref 8.4–10.5)
CHLORIDE: 107 meq/L (ref 96–112)
CO2: 26 meq/L (ref 19–32)
Creatinine, Ser: 0.74 mg/dL (ref 0.40–1.20)
GFR: 84.68 mL/min (ref >60.00)
Glucose, Bld: 109 mg/dL — ABNORMAL HIGH (ref 70–99)
POTASSIUM: 4.5 meq/L (ref 3.5–5.1)
Sodium: 140 mEq/L (ref 135–145)
Total Bilirubin: 0.5 mg/dL (ref 0.2–1.2)
Total Protein: 6.8 g/dL (ref 6.0–8.3)

## 2015-11-06 LAB — SEDIMENTATION RATE: SED RATE: 16 mm/h (ref 0–22)

## 2015-11-06 LAB — HEMOGLOBIN A1C: Hgb A1c MFr Bld: 5.7 % (ref 4.6–6.5)

## 2015-11-06 LAB — TSH: TSH: 1.43 u[IU]/mL (ref 0.35–4.50)

## 2015-11-06 MED ORDER — MELOXICAM 15 MG PO TABS: 15.0000 mg | ORAL_TABLET | Freq: Every day | ORAL | Status: DC

## 2015-11-06 NOTE — Addendum Note (Signed)
Addended by: Vergia Alcon D on: 11/06/2015 09:24 AM   Modules accepted: Orders

## 2015-11-06 NOTE — Patient Instructions (Signed)
Start Meloxicam 15mg  daily to help with knee pain. Do not take Ibuprofen while on this medication.  Xrays of your knees today.  Labs today.  Follow up in 4 weeks.

## 2015-11-06 NOTE — Progress Notes (Signed)
Subjective:    Patient ID: Lauren Callahan, female    DOB: 10/20/1955, 60 y.o.   MRN: [id number]  HPI  60YO female presents for acute visit.  Knot under tongue - Seen at [facility] in past for benign tumor.   Knees - Bilateral knee pain with prolonged kneeling. Sometimes has swelling in knees. Not exercising, but knee pain with activity. Not taking anything except occasional ibuprofen.  Stopped Wellbutrin.  Wt Readings from Last 3 Encounters:  11/06/15 206 lb 8 oz (93.668 kg)  10/22/14 197 lb 2 oz (89.415 kg)  09/23/14 194 lb (87.998 kg)   BP Readings from Last 3 Encounters:  11/06/15 130/84  10/22/14 123/78  09/23/14 140/92    Past Medical History  Diagnosis Date  . Endometrial polyp   . Endocervical polyp   . Menorrhagia   . Fibroid   . Endometriosis   . Breast mass   . Benign fundic gland polyps of stomach   . Depression   . GERD (gastroesophageal reflux disease)   . Gastric polyp     Dr. Tiffany Kocher  . Clotting disorder ([place])     left leg   Family History  Problem Relation Age of Onset  . Diabetes Mother   . Diabetes Father   . Hypertension Father   . Heart disease Father     heart attacks   . Heart attack Father   . Diabetes Maternal Grandfather    Past Surgical History  Procedure Laterality Date  . Dilation and curettage of uterus    . Hysteroscopy    . Tubal ligation    . Vaginal hysterectomy  2001    [facility]  Left Salpingectomy  . Tongue tumor  2006    Benign, Granular tumor, Dr. Carlis Abbott   Social History   Social History  . Marital Status: Married    Spouse Name: N/A  . Number of Children: N/A  . Years of Education: N/A   Social History Main Topics  . Smoking status: Never Smoker   . Smokeless tobacco: None  . Alcohol Use: Yes     Comment: occassionally  . Drug Use: No  . Sexual Activity: Yes    Birth Control/ Protection: Surgical   Other Topics Concern  . None   Social History Narrative   Lives in [place] with husband.      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  HENT: Negative for mouth sores and trouble swallowing.   Eyes: Negative for visual disturbance.  Respiratory: Negative for shortness of breath.   Cardiovascular: Negative for chest pain and leg swelling.  Gastrointestinal: Negative for abdominal pain.  Musculoskeletal: Positive for myalgias, joint swelling and arthralgias.  Skin: Negative for color change and rash.  Hematological: Negative for adenopathy. Does not bruise/bleed easily.  Psychiatric/Behavioral: Negative for dysphoric mood. The patient is not nervous/anxious.        Objective:    BP 130/84 mmHg  Pulse 78  Temp(Src) 97.9 F (36.6 C) (Oral)  Ht 5' 4.5" (1.638 m)  Wt 206 lb 8 oz (93.668 kg)  BMI 34.91 kg/m2  SpO2 97% Physical Exam  Constitutional: She is oriented to person, place, and time. She appears well-developed and well-nourished. No distress.  HENT:  Head: Normocephalic and atraumatic.  Right Ear: External ear  normal.  Left Ear: External ear normal.  Nose: Nose normal.  Mouth/Throat: Oropharynx is clear and moist. No oropharyngeal exudate.  Eyes: Conjunctivae are normal. Pupils are equal, round, and reactive to light. Right eye exhibits no discharge. Left eye exhibits no discharge. No scleral icterus.  Neck: Normal range of motion. Neck supple. No tracheal deviation present. No thyromegaly present.  Cardiovascular: Normal rate, regular rhythm, normal heart sounds and intact distal pulses.  Exam reveals no gallop and no friction rub.   No murmur heard. Pulmonary/Chest: Effort normal and breath sounds normal. No respiratory distress. She has no wheezes. She has no rales. She exhibits no tenderness.  Musculoskeletal: Normal range of motion. She exhibits no edema.       Right knee: She exhibits normal range of  motion and no swelling. Tenderness found. Lateral joint line tenderness noted.       Left knee: She exhibits normal range of motion and no swelling. No tenderness found.  Lymphadenopathy:    She has no cervical adenopathy.  Neurological: She is alert and oriented to person, place, and time. No cranial nerve deficit. She exhibits normal muscle tone. Coordination normal.  Skin: Skin is warm and dry. No rash noted. She is not diaphoretic. No erythema. No pallor.  Psychiatric: She has a normal mood and affect. Her behavior is normal. Judgment and thought content normal.          Assessment & Plan:   Problem List Items Addressed This Visit      Unprioritized   Knee pain, bilateral - Primary    Symptoms most c/w OA. Will get plain xray. Start Meloxicam. Encouraged weight loss and exercise. Follow up 4 weeks.      Relevant Orders   DG Knee Complete 4 Views Left   DG Knee Complete 4 Views Right   ANA   Obesity (BMI 30-39.9)    Wt Readings from Last 3 Encounters:  11/06/15 206 lb 8 oz (93.668 kg)  10/22/14 197 lb 2 oz (89.415 kg)  09/23/14 194 lb (87.998 kg)   Body mass index is 34.91 kg/(m^2). Encouraged her to start Weight Watchers and set goal of regular exercise.      Routine general medical examination at a health care facility   Relevant Orders   CBC with Differential/Platelet   Comprehensive metabolic panel   Lipid panel   TSH   Sed Rate (ESR)   Hemoglobin A1c   Tongue lesion    Small papule underneath tongue right side. Appears most consistent with salivary gland enlargement. Encouraged follow up with ENT.          Return in about 4 weeks (around 12/04/2015) for Physical.  [name], MD [facility]

## 2015-11-06 NOTE — Progress Notes (Signed)
Pre visit review using our clinic review tool, if applicable. No additional management support is needed unless otherwise documented below in the visit note. 

## 2015-11-06 NOTE — Assessment & Plan Note (Signed)
Wt Readings from Last 3 Encounters:  11/06/15 206 lb 8 oz (93.668 kg)  10/22/14 197 lb 2 oz (89.415 kg)  09/23/14 194 lb (87.998 kg)   Body mass index is 34.91 kg/(m^2). Encouraged her to start Weight Watchers and set goal of regular exercise.

## 2015-11-06 NOTE — Assessment & Plan Note (Signed)
Symptoms most c/w OA. Will get plain xray. Start Meloxicam. Encouraged weight loss and exercise. Follow up 4 weeks.

## 2015-11-06 NOTE — Assessment & Plan Note (Signed)
Small papule underneath tongue right side. Appears most consistent with salivary gland enlargement. Encouraged follow up with ENT.

## 2015-11-09 LAB — ANA: ANA: NEGATIVE

## 2015-12-02 ENCOUNTER — Encounter: Payer: Self-pay | Admitting: Internal Medicine

## 2015-12-02 ENCOUNTER — Ambulatory Visit (INDEPENDENT_AMBULATORY_CARE_PROVIDER_SITE_OTHER): Payer: BLUE CROSS/BLUE SHIELD | Admitting: Internal Medicine

## 2015-12-02 VITALS — BP 107/71 | HR 71 | Temp 97.8°F | Ht 64.5 in

## 2015-12-02 DIAGNOSIS — R945 Abnormal results of liver function studies: Secondary | ICD-10-CM

## 2015-12-02 DIAGNOSIS — E669 Obesity, unspecified: Secondary | ICD-10-CM

## 2015-12-02 DIAGNOSIS — M25561 Pain in right knee: Secondary | ICD-10-CM

## 2015-12-02 DIAGNOSIS — K148 Other diseases of tongue: Secondary | ICD-10-CM

## 2015-12-02 DIAGNOSIS — R7989 Other specified abnormal findings of blood chemistry: Secondary | ICD-10-CM

## 2015-12-02 DIAGNOSIS — Z0001 Encounter for general adult medical examination with abnormal findings: Secondary | ICD-10-CM | POA: Diagnosis not present

## 2015-12-02 DIAGNOSIS — Z0000 Encounter for general adult medical examination without abnormal findings: Secondary | ICD-10-CM

## 2015-12-02 DIAGNOSIS — M25562 Pain in left knee: Secondary | ICD-10-CM

## 2015-12-02 DIAGNOSIS — E781 Pure hyperglyceridemia: Secondary | ICD-10-CM

## 2015-12-02 MED ORDER — MELOXICAM 15 MG PO TABS: 15.0000 mg | ORAL_TABLET | Freq: Every day | ORAL | Status: DC

## 2015-12-02 NOTE — Assessment & Plan Note (Signed)
Discussed potential causes. Recommended repeat LFTs. Will plan to repeat in 4 weeks.

## 2015-12-02 NOTE — Progress Notes (Signed)
Pre visit review using our clinic review tool, if applicable. No additional management support is needed unless otherwise documented below in the visit note. 

## 2015-12-02 NOTE — Assessment & Plan Note (Signed)
Pain improved with Meloxicam. Reviewed XRAYS which showed no acute process. Continue meloxicam prn.

## 2015-12-02 NOTE — Assessment & Plan Note (Addendum)
General medical exam including breast exam normal today. Pelvic exam and PAP deferred as pt s/p hysterectomy. Mammogram UTD. Colonoscopy UTD. Labs reviewed. Immunizations. UTD. Encouraged healthy diet and exercise.

## 2015-12-02 NOTE — Patient Instructions (Signed)
Health Maintenance, Female Adopting a healthy lifestyle and getting preventive care can go a long way to promote health and wellness. Talk with your health care provider about what schedule of regular examinations is right for you. This is a good chance for you to check in with your provider about disease prevention and staying healthy. In between checkups, there are plenty of things you can do on your own. Experts have done a lot of research about which lifestyle changes and preventive measures are most likely to keep you healthy. Ask your health care provider for more information. WEIGHT AND DIET  Eat a healthy diet  Be sure to include plenty of vegetables, fruits, low-fat dairy products, and lean protein.  Do not eat a lot of foods high in solid fats, added sugars, or salt.  Get regular exercise. This is one of the most important things you can do for your health.  Most adults should exercise for at least 150 minutes each week. The exercise should increase your heart rate and make you sweat (moderate-intensity exercise).  Most adults should also do strengthening exercises at least twice a week. This is in addition to the moderate-intensity exercise.  Maintain a healthy weight  Body mass index (BMI) is a measurement that can be used to identify possible weight problems. It estimates body fat based on height and weight. Your health care provider can help determine your BMI and help you achieve or maintain a healthy weight.  For females 20 years of age and older:   A BMI below 18.5 is considered underweight.  A BMI of 18.5 to 24.9 is normal.  A BMI of 25 to 29.9 is considered overweight.  A BMI of 30 and above is considered obese.  Watch levels of cholesterol and blood lipids  You should start having your blood tested for lipids and cholesterol at 62 years of age, then have this test every 5 years.  You may need to have your cholesterol levels checked more often if:  Your lipid  or cholesterol levels are high.  You are older than 62 years of age.  You are at high risk for heart disease.  CANCER SCREENING   Lung Cancer  Lung cancer screening is recommended for adults 55-80 years old who are at high risk for lung cancer because of a history of smoking.  A yearly low-dose CT scan of the lungs is recommended for people who:  Currently smoke.  Have quit within the past 15 years.  Have at least a 30-pack-year history of smoking. A pack year is smoking an average of one pack of cigarettes a day for 1 year.  Yearly screening should continue until it has been 15 years since you quit.  Yearly screening should stop if you develop a health problem that would prevent you from having lung cancer treatment.  Breast Cancer  Practice breast self-awareness. This means understanding how your breasts normally appear and feel.  It also means doing regular breast self-exams. Let your health care provider know about any changes, no matter how small.  If you are in your 20s or 30s, you should have a clinical breast exam (CBE) by a health care provider every 1-3 years as part of a regular health exam.  If you are 40 or older, have a CBE every year. Also consider having a breast X-ray (mammogram) every year.  If you have a family history of breast cancer, talk to your health care provider about genetic screening.  If you   are at high risk for breast cancer, talk to your health care provider about having an MRI and a mammogram every year.  Breast cancer gene (BRCA) assessment is recommended for women who have family members with BRCA-related cancers. BRCA-related cancers include:  Breast.  Ovarian.  Tubal.  Peritoneal cancers.  Results of the assessment will determine the need for genetic counseling and BRCA1 and BRCA2 testing. Cervical Cancer Your health care provider may recommend that you be screened regularly for cancer of the pelvic organs (ovaries, uterus, and  vagina). This screening involves a pelvic examination, including checking for microscopic changes to the surface of your cervix (Pap test). You may be encouraged to have this screening done every 3 years, beginning at age 21.  For women ages 30-65, health care providers may recommend pelvic exams and Pap testing every 3 years, or they may recommend the Pap and pelvic exam, combined with testing for human papilloma virus (HPV), every 5 years. Some types of HPV increase your risk of cervical cancer. Testing for HPV may also be done on women of any age with unclear Pap test results.  Other health care providers may not recommend any screening for nonpregnant women who are considered low risk for pelvic cancer and who do not have symptoms. Ask your health care provider if a screening pelvic exam is right for you.  If you have had past treatment for cervical cancer or a condition that could lead to cancer, you need Pap tests and screening for cancer for at least 20 years after your treatment. If Pap tests have been discontinued, your risk factors (such as having a new sexual partner) need to be reassessed to determine if screening should resume. Some women have medical problems that increase the chance of getting cervical cancer. In these cases, your health care provider may recommend more frequent screening and Pap tests. Colorectal Cancer  This type of cancer can be detected and often prevented.  Routine colorectal cancer screening usually begins at 62 years of age and continues through 62 years of age.  Your health care provider may recommend screening at an earlier age if you have risk factors for colon cancer.  Your health care provider may also recommend using home test kits to check for hidden blood in the stool.  A small camera at the end of a tube can be used to examine your colon directly (sigmoidoscopy or colonoscopy). This is done to check for the earliest forms of colorectal  cancer.  Routine screening usually begins at age 50.  Direct examination of the colon should be repeated every 5-10 years through 62 years of age. However, you may need to be screened more often if early forms of precancerous polyps or small growths are found. Skin Cancer  Check your skin from head to toe regularly.  Tell your health care provider about any new moles or changes in moles, especially if there is a change in a mole's shape or color.  Also tell your health care provider if you have a mole that is larger than the size of a pencil eraser.  Always use sunscreen. Apply sunscreen liberally and repeatedly throughout the day.  Protect yourself by wearing long sleeves, pants, a wide-brimmed hat, and sunglasses whenever you are outside. HEART DISEASE, DIABETES, AND HIGH BLOOD PRESSURE   High blood pressure causes heart disease and increases the risk of stroke. High blood pressure is more likely to develop in:  People who have blood pressure in the high end   of the normal range (130-139/85-89 mm Hg).  People who are overweight or obese.  People who are African American.  If you are 38-23 years of age, have your blood pressure checked every 3-5 years. If you are 61 years of age or older, have your blood pressure checked every year. You should have your blood pressure measured twice--once when you are at a hospital or clinic, and once when you are not at a hospital or clinic. Record the average of the two measurements. To check your blood pressure when you are not at a hospital or clinic, you can use:  An automated blood pressure machine at a pharmacy.  A home blood pressure monitor.  If you are between 45 years and 39 years old, ask your health care provider if you should take aspirin to prevent strokes.  Have regular diabetes screenings. This involves taking a blood sample to check your fasting blood sugar level.  If you are at a normal weight and have a low risk for diabetes,  have this test once every three years after 62 years of age.  If you are overweight and have a high risk for diabetes, consider being tested at a younger age or more often. PREVENTING INFECTION  Hepatitis B  If you have a higher risk for hepatitis B, you should be screened for this virus. You are considered at high risk for hepatitis B if:  You were born in a country where hepatitis B is common. Ask your health care provider which countries are considered high risk.  Your parents were born in a high-risk country, and you have not been immunized against hepatitis B (hepatitis B vaccine).  You have HIV or AIDS.  You use needles to inject street drugs.  You live with someone who has hepatitis B.  You have had sex with someone who has hepatitis B.  You get hemodialysis treatment.  You take certain medicines for conditions, including cancer, organ transplantation, and autoimmune conditions. Hepatitis C  Blood testing is recommended for:  Everyone born from [birth year] through 1967.  Anyone with known risk factors for hepatitis C. Sexually transmitted infections (STIs)  You should be screened for sexually transmitted infections (STIs) including gonorrhea and chlamydia if:  You are sexually active and are younger than 62 years of age.  You are older than 62 years of age and your health care provider tells you that you are at risk for this type of infection.  Your sexual activity has changed since you were last screened and you are at an increased risk for chlamydia or gonorrhea. Ask your health care provider if you are at risk.  If you do not have HIV, but are at risk, it may be recommended that you take a prescription medicine daily to prevent HIV infection. This is called pre-exposure prophylaxis (PrEP). You are considered at risk if:  You are sexually active and do not regularly use condoms or know the HIV status of your partner(s).  You take drugs by injection.  You are sexually  active with a partner who has HIV. Talk with your health care provider about whether you are at high risk of being infected with HIV. If you choose to begin PrEP, you should first be tested for HIV. You should then be tested every 3 months for as long as you are taking PrEP.  PREGNANCY   If you are premenopausal and you may become pregnant, ask your health care provider about preconception counseling.  If you may  become pregnant, take 400 to 800 micrograms (mcg) of folic acid every day.  If you want to prevent pregnancy, talk to your health care provider about birth control (contraception). OSTEOPOROSIS AND MENOPAUSE   Osteoporosis is a disease in which the bones lose minerals and strength with aging. This can result in serious bone fractures. Your risk for osteoporosis can be identified using a bone density scan.  If you are 61 years of age or older, or if you are at risk for osteoporosis and fractures, ask your health care provider if you should be screened.  Ask your health care provider whether you should take a calcium or vitamin D supplement to lower your risk for osteoporosis.  Menopause may have certain physical symptoms and risks.  Hormone replacement therapy may reduce some of these symptoms and risks. Talk to your health care provider about whether hormone replacement therapy is right for you.  HOME CARE INSTRUCTIONS   Schedule regular health, dental, and eye exams.  Stay current with your immunizations.   Do not use any tobacco products including cigarettes, chewing tobacco, or electronic cigarettes.  If you are pregnant, do not drink alcohol.  If you are breastfeeding, limit how much and how often you drink alcohol.  Limit alcohol intake to no more than 1 drink per day for nonpregnant women. One drink equals 12 ounces of beer, 5 ounces of wine, or 1 ounces of hard liquor.  Do not use street drugs.  Do not share needles.  Ask your health care provider for help if  you need support or information about quitting drugs.  Tell your health care provider if you often feel depressed.  Tell your health care provider if you have ever been abused or do not feel safe at home.   This information is not intended to replace advice given to you by your health care provider. Make sure you discuss any questions you have with your health care provider.   Document Released: 03/14/2011 Document Revised: 09/19/2014 Document Reviewed: 07/31/2013 Elsevier Interactive Patient Education [employer].

## 2015-12-02 NOTE — Assessment & Plan Note (Signed)
Elevated TG. Encouraged healthy diet and exercise. Discussed metabolic syndrome and risks of heart disease. Recheck lipids in 4 weeks.

## 2015-12-02 NOTE — Assessment & Plan Note (Signed)
Wt Readings from Last 3 Encounters:  11/06/15 206 lb 8 oz (93.668 kg)  10/22/14 197 lb 2 oz (89.415 kg)  09/23/14 194 lb (87.998 kg)   There is no weight on file to calculate BMI. Pt declined weight today. Encouraged her to continue with healthy diet and exercise with goal of weight loss.

## 2015-12-02 NOTE — Assessment & Plan Note (Signed)
Reviewed notes from [facility]. Suspected benign lesion. Recommended evaluation again in 3 months. Will follow.

## 2015-12-02 NOTE — Progress Notes (Signed)
Subjective:    Patient ID: Lauren Callahan, female    DOB: 04/17/1956, 60 y.o.   MRN: [id number]  HPI  60YO female presents for physical exam.  Last seen for knee pain. xrays were normal. Pain improved with use of Meloxicam.  Also started on Weight Watchers. Has lost about 5lb. Increasing water intake.  Seen at [facility] for lesion under her tongue. Plans for recheck in 3 months.   Wt Readings from Last 3 Encounters:  11/06/15 206 lb 8 oz (93.668 kg)  10/22/14 197 lb 2 oz (89.415 kg)  09/23/14 194 lb (87.998 kg)   BP Readings from Last 3 Encounters:  12/02/15 107/71  11/06/15 130/84  10/22/14 123/78    Past Medical History  Diagnosis Date  . Endometrial polyp   . Endocervical polyp   . Menorrhagia   . Fibroid   . Endometriosis   . Breast mass   . Benign fundic gland polyps of stomach   . Depression   . GERD (gastroesophageal reflux disease)   . Gastric polyp     Dr. Tiffany Kocher  . Clotting disorder ([place])     left leg   Family History  Problem Relation Age of Onset  . Diabetes Mother   . Diabetes Father   . Hypertension Father   . Heart disease Father     heart attacks   . Heart attack Father   . Diabetes Maternal Grandfather    Past Surgical History  Procedure Laterality Date  . Dilation and curettage of uterus    . Hysteroscopy    . Tubal ligation    . Vaginal hysterectomy  2001    [facility]  Left Salpingectomy  . Tongue tumor  2006    Benign, Granular tumor, Dr. Carlis Abbott   Social History   Social History  . Marital Status: Married    Spouse Name: N/A  . Number of Children: N/A  . Years of Education: N/A   Social History Main Topics  . Smoking status: Never Smoker   . Smokeless tobacco: None  . Alcohol Use: Yes     Comment: occassionally  . Drug Use: No  . Sexual Activity: Yes    Birth Control/ Protection: Surgical   Other Topics Concern  . None   Social History Narrative   Lives in [place] with husband.      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  Eyes: Negative for visual disturbance.  Respiratory: Negative for shortness of breath.   Cardiovascular: Negative for chest pain and leg swelling.  Gastrointestinal: Negative for nausea, vomiting, abdominal pain, diarrhea and constipation.  Musculoskeletal: Negative for myalgias and arthralgias.  Skin: Negative for color change and rash.  Hematological: Negative for adenopathy. Does not bruise/bleed easily.  Psychiatric/Behavioral: Negative for sleep disturbance and dysphoric mood. The patient is not nervous/anxious.        Objective:    BP 107/71 mmHg  Pulse 71  Temp(Src) 97.8 F (36.6 C) (Oral)  Ht 5' 4.5" (1.638 m)  Wt   SpO2 97% Physical Exam  Constitutional: She is oriented to person, place, and time. She appears well-developed and well-nourished. No distress.  HENT:  Head: Normocephalic and atraumatic.  Right Ear: External ear normal.  Left Ear: External ear normal.  Nose: Nose normal.  Mouth/Throat:  Oropharynx is clear and moist. No oropharyngeal exudate.  Eyes: Conjunctivae are normal. Pupils are equal, round, and reactive to light. Right eye exhibits no discharge. Left eye exhibits no discharge. No scleral icterus.  Neck: Normal range of motion. Neck supple. No tracheal deviation present. No thyromegaly present.  Cardiovascular: Normal rate, regular rhythm, normal heart sounds and intact distal pulses.  Exam reveals no gallop and no friction rub.   No murmur heard. Pulmonary/Chest: Effort normal and breath sounds normal. No accessory muscle usage. No tachypnea. No respiratory distress. She has no decreased breath sounds. She has no wheezes. She has no rales. She exhibits no tenderness. Right breast exhibits no inverted nipple, no mass, no nipple discharge, no skin change and no tenderness. Left  breast exhibits no inverted nipple, no mass, no nipple discharge, no skin change and no tenderness. Breasts are symmetrical.  Abdominal: Soft. Bowel sounds are normal. She exhibits no distension and no mass. There is no tenderness. There is no rebound and no guarding.  Musculoskeletal: Normal range of motion. She exhibits no edema or tenderness.  Lymphadenopathy:    She has no cervical adenopathy.  Neurological: She is alert and oriented to person, place, and time. No cranial nerve deficit. She exhibits normal muscle tone. Coordination normal.  Skin: Skin is warm and dry. No rash noted. She is not diaphoretic. No erythema. No pallor.  Psychiatric: She has a normal mood and affect. Her behavior is normal. Judgment and thought content normal.          Assessment & Plan:   Problem List Items Addressed This Visit      Unprioritized   Elevated liver function tests    Discussed potential causes. Recommended repeat LFTs. Will plan to repeat in 4 weeks.      Relevant Orders   Comprehensive metabolic panel   Lipid panel   Hypertriglyceridemia    Elevated TG. Encouraged healthy diet and exercise. Discussed metabolic syndrome and risks of heart disease. Recheck lipids in 4 weeks.      Knee pain, bilateral    Pain improved with Meloxicam. Reviewed XRAYS which showed no acute process. Continue meloxicam prn.      Obesity (BMI 30-39.9)    Wt Readings from Last 3 Encounters:  11/06/15 206 lb 8 oz (93.668 kg)  10/22/14 197 lb 2 oz (89.415 kg)  09/23/14 194 lb (87.998 kg)   There is no weight on file to calculate BMI. Pt declined weight today. Encouraged her to continue with healthy diet and exercise with goal of weight loss.      Routine general medical examination at a health care facility - Primary    General medical exam including breast exam normal today. Pelvic exam and PAP deferred as pt s/p hysterectomy. Mammogram UTD. Colonoscopy UTD. Labs reviewed. Immunizations. UTD. Encouraged  healthy diet and exercise.       Tongue lesion    Reviewed notes from [facility]. Suspected benign lesion. Recommended evaluation again in 3 months. Will follow.          Return in about 6 months (around 06/03/2016) for Recheck.  [name], MD Internal [facility]

## 2015-12-10 ENCOUNTER — Other Ambulatory Visit: Payer: Self-pay | Admitting: Internal Medicine

## 2015-12-11 NOTE — Telephone Encounter (Signed)
medication given 10/2015 and last seen 11/2015. Please advise?

## 2016-01-14 ENCOUNTER — Other Ambulatory Visit: Payer: Self-pay | Admitting: Internal Medicine

## 2016-01-15 NOTE — Telephone Encounter (Signed)
Xanax refill reqest.  Last seen 11/2015, last filled 11/2015.  Please advise.

## 2016-03-16 ENCOUNTER — Other Ambulatory Visit: Payer: Self-pay | Admitting: Internal Medicine

## 2016-03-16 NOTE — Telephone Encounter (Signed)
Refill request for Xanax, last seen 22MAR2017, last filled [id number].  Please advise.

## 2016-05-27 ENCOUNTER — Other Ambulatory Visit: Payer: Self-pay

## 2016-05-27 MED ORDER — ALPRAZOLAM 0.5 MG PO TABS: ORAL_TABLET | ORAL | 0 refills | Status: DC

## 2016-05-27 NOTE — Telephone Encounter (Signed)
Please advise refill, last appt was in [place] with [facility] and no follow up.  LAst refill was in May of this year.

## 2016-05-27 NOTE — Telephone Encounter (Signed)
faxed

## 2016-06-07 ENCOUNTER — Ambulatory Visit: Payer: BLUE CROSS/BLUE SHIELD | Admitting: Internal Medicine

## 2016-06-14 DIAGNOSIS — Z8371 Family history of colonic polyps: Secondary | ICD-10-CM | POA: Insufficient documentation

## 2016-06-23 ENCOUNTER — Telehealth: Payer: Self-pay | Admitting: Family Medicine

## 2016-06-23 ENCOUNTER — Emergency Department: Payer: BLUE CROSS/BLUE SHIELD

## 2016-06-23 ENCOUNTER — Telehealth: Payer: Self-pay | Admitting: *Deleted

## 2016-06-23 ENCOUNTER — Encounter: Payer: Self-pay | Admitting: Emergency Medicine

## 2016-06-23 ENCOUNTER — Emergency Department
Admission: EM | Admit: 2016-06-23 | Discharge: 2016-06-23 | Disposition: A | Discharge status: Home / Self Care | Payer: BLUE CROSS/BLUE SHIELD | Attending: Emergency Medicine | Admitting: Emergency Medicine

## 2016-06-23 DIAGNOSIS — Z79899 Other long term (current) drug therapy: Secondary | ICD-10-CM | POA: Insufficient documentation

## 2016-06-23 DIAGNOSIS — Z5181 Encounter for therapeutic drug level monitoring: Secondary | ICD-10-CM | POA: Insufficient documentation

## 2016-06-23 DIAGNOSIS — R42 Dizziness and giddiness: Secondary | ICD-10-CM | POA: Diagnosis present

## 2016-06-23 LAB — COMPREHENSIVE METABOLIC PANEL
ALK PHOS: 71 U/L (ref 38–126)
ALT: 45 U/L (ref 14–54)
AST: 33 U/L (ref 15–41)
Albumin: 4.6 g/dL (ref 3.5–5.0)
Anion gap: 7 (ref 5–15)
BILIRUBIN TOTAL: 0.7 mg/dL (ref 0.3–1.2)
BUN: 10 mg/dL (ref 6–20)
CALCIUM: 9.4 mg/dL (ref 8.9–10.3)
CO2: 26 mmol/L (ref 22–32)
CREATININE: 0.81 mg/dL (ref 0.44–1.00)
Chloride: 109 mmol/L (ref 101–111)
GFR calc non Af Amer: >60 mL/min (ref >60)
Glucose, Bld: 153 mg/dL — ABNORMAL HIGH (ref 65–99)
Potassium: 3.2 mmol/L — ABNORMAL LOW (ref 3.5–5.1)
SODIUM: 142 mmol/L (ref 135–145)
TOTAL PROTEIN: 7.8 g/dL (ref 6.5–8.1)

## 2016-06-23 LAB — CBC WITH DIFFERENTIAL/PLATELET
BASOS PCT: 1 %
Basophils Absolute: 0.1 10*3/uL (ref 0–0.1)
EOS ABS: 0.1 10*3/uL (ref 0–0.7)
Eosinophils Relative: 1 %
HCT: 43.6 % (ref 35.0–47.0)
HEMOGLOBIN: 14.9 g/dL (ref 12.0–16.0)
Lymphocytes Relative: 31 %
Lymphs Abs: 3 10*3/uL (ref 1.0–3.6)
MCH: 31.7 pg (ref 26.0–34.0)
MCHC: 34.3 g/dL (ref 32.0–36.0)
MCV: 92.6 fL (ref 80.0–100.0)
MONO ABS: 0.4 10*3/uL (ref 0.2–0.9)
MONOS PCT: 4 %
NEUTROS PCT: 63 %
Neutro Abs: 6.1 10*3/uL (ref 1.4–6.5)
Platelets: 328 10*3/uL (ref 150–440)
RBC: 4.71 MIL/uL (ref 3.80–5.20)
RDW: 13.9 % (ref 11.5–14.5)
WBC: 9.7 10*3/uL (ref 3.6–11.0)

## 2016-06-23 LAB — ETHANOL: Alcohol, Ethyl (B): <5 mg/dL (ref <5)

## 2016-06-23 LAB — PROTIME-INR
INR: 0.97
PROTHROMBIN TIME: 12.9 s (ref 11.4–15.2)

## 2016-06-23 LAB — GLUCOSE, CAPILLARY: Glucose-Capillary: 139 mg/dL — ABNORMAL HIGH (ref 65–99)

## 2016-06-23 MED ORDER — MECLIZINE HCL 25 MG PO TABS
25.0000 mg | ORAL_TABLET | Freq: Once | ORAL | Status: AC
  Administered 2016-06-23: 25 mg via ORAL
  Filled 2016-06-23: qty 1

## 2016-06-23 MED ORDER — SODIUM CHLORIDE 0.9 % IV BOLUS (SEPSIS)
1000.0000 mL | Freq: Once | INTRAVENOUS | Status: AC
  Administered 2016-06-23: 1000 mL via INTRAVENOUS

## 2016-06-23 MED ORDER — ONDANSETRON HCL 4 MG/2ML IJ SOLN
4.0000 mg | Freq: Once | INTRAMUSCULAR | Status: AC
  Administered 2016-06-23: 4 mg via INTRAVENOUS
  Filled 2016-06-23: qty 2

## 2016-06-23 MED ORDER — MECLIZINE HCL 25 MG PO TABS: 25.0000 mg | ORAL_TABLET | Freq: Three times a day (TID) | ORAL | 0 refills | Status: DC | PRN

## 2016-06-23 NOTE — ED Triage Notes (Signed)
Reports lightheaded ness off and on all day since waking up at 5am.  Reports tingling in right arm and right jaw onset 1620

## 2016-06-23 NOTE — Telephone Encounter (Signed)
Received message, Per EPIC patient in the ED Code stroke called.  Will follow up.  Patient has not established with a new PCP since Dr. Gilford Rile left.

## 2016-06-23 NOTE — Telephone Encounter (Signed)
Patient is currently in the ED will follow up. thanks

## 2016-06-23 NOTE — ED Notes (Signed)
Soc  cnl  Per  Dr  Burlene Arnt MD

## 2016-06-23 NOTE — ED Provider Notes (Addendum)
[facility] Emergency Department Provider Note  ____________________________________________   I have reviewed the triage vital signs and the nursing notes.   HISTORY  Chief Complaint Code Stroke    HPI Lauren Callahan is a 60 y.o. female who presents today with vertigo. Patient began with true vertigo this point 5:00 in the morning. She states as long as she lies still she has no symptoms when she sits up or moves she has a spinning sensation. This has lasted all day. She states that she has not had any focal numbness or weakness. She feels that she is somewhat unsteady when she walks as a result. The patient states that she has had no headache, no fever no vomiting. She has had no recent URI symptoms. No ear pain. She called her doctor and they asked her about other symptoms and she said she had a "chill" or a "small tingle" on her right jaw and her right arm. This has completely resolved. She noted no time had any word finding difficulty, headache, difficulty speaking or talking, she denies any focal numbness or weakness. She has not had vertigo before. She denies any new medications. She denies any decreased hearing.    Past Medical History:  Diagnosis Date  . Benign fundic gland polyps of stomach   . Breast mass   . Clotting disorder (HCC)    left leg  . Depression   . Endocervical polyp   . Endometrial polyp   . Endometriosis   . Fibroid   . Gastric polyp    Dr. Tiffany Kocher  . GERD (gastroesophageal reflux disease)   . Menorrhagia     Patient Active Problem List   Diagnosis Date Noted  . Elevated liver function tests 12/02/2015  . Hypertriglyceridemia 12/02/2015  . Knee pain, bilateral 11/06/2015  . Tongue lesion 11/06/2015  . Routine general medical examination at a health care facility 10/22/2014  . Adjustment disorder with mixed anxiety and depressed mood 09/17/2014  . Obesity (BMI 30-39.9) 09/17/2014    Past Surgical History:  Procedure  Laterality Date  . DILATION AND CURETTAGE OF UTERUS    . HYSTEROSCOPY    . 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 Carlis Abbott  . TUBAL LIGATION    . VAGINAL HYSTERECTOMY  2001   [facility]  Left Salpingectomy    Prior to Admission medications   Medication Sig Start Date End Date Taking? Authorizing Provider  omeprazole (PRILOSEC) 40 MG capsule TAKE 1 CAPSULE (40 MG TOTAL) BY MOUTH 2 (TWO) TIMES DAILY. Patient taking differently: TAKE 1 CAPSULE (40 MG TOTAL) BY MOUTH DAILY 10/20/15  Yes Jackolyn Confer, MD  ALPRAZolam Duanne Moron) 0.5 MG tablet TAKE 1 TABLET BY MOUTH IN THE EVENING AS NEEDED Patient not taking: Reported on 06/23/2016 05/27/16   Coral Spikes, DO  meloxicam (MOBIC) 15 MG tablet Take 1 tablet (15 mg total) by mouth daily. 12/02/15   Jackolyn Confer, MD    Allergies Macrodantin [nitrofurantoin macrocrystal]  Family History  Problem Relation Age of Onset  . Diabetes Mother   . Diabetes Father   . Hypertension Father   . Heart disease Father     heart attacks   . Heart attack Father   . Diabetes Maternal Grandfather     Social History Social History  Substance Use Topics  . Smoking status: Never Smoker  . Smokeless tobacco: Never Used  . Alcohol use Yes     Comment: occassionally    Review of Systems  Constitutional: No fever/chills Eyes: No visual changes. ENT: No sore throat. No stiff neck no neck pain Cardiovascular: Denies chest pain. Respiratory: Denies shortness of breath. Gastrointestinal:   no vomiting.  No diarrhea.  No constipation. Genitourinary: Negative for dysuria. Musculoskeletal: Negative lower extremity swelling Skin: Negative for rash. Neurological: Negative for severe headaches, focal weakness or numbness. 10-point ROS otherwise negative.  ____________________________________________   PHYSICAL EXAM:  VITAL SIGNS: ED Triage Vitals  Enc Vitals Group     BP 06/23/16 1656 (!) 173/95     Pulse Rate 06/23/16 1656 73     Resp 06/23/16  1656 16     Temp 06/23/16 1656 97.9 F (36.6 C)     Temp Source 06/23/16 1656 Oral     SpO2 06/23/16 1656 100 %     Weight 06/23/16 1657 190 lb (86.2 kg)     Height 06/23/16 1657 5\' 5"  (1.651 m)     Head Circumference --      Peak Flow --      Pain Score --      Pain Loc --      Pain Edu? --      Excl. in [place]? --     Constitutional: Alert and oriented. Well appearing and in no acute distress. Eyes: Conjunctivae are normal. PERRL. EOMI. Head: Atraumatic. Nose: No congestion/rhinnorhea. Mouth/Throat: Mucous membranes are moist.  Oropharynx non-erythematous.TMs are normal Neck: No stridor.   Nontender with no meningismus Cardiovascular: Normal rate, regular rhythm. Grossly normal heart sounds.  Good peripheral circulation. Respiratory: Normal respiratory effort.  No retractions. Lungs CTAB. Abdominal: Soft and nontender. No distention. No guarding no rebound Back:  There is no focal tenderness or step off.  there is no midline tenderness there are no lesions noted. there is no CVA tenderness Musculoskeletal: No lower extremity tenderness, no upper extremity tenderness. No joint effusions, no DVT signs strong distal pulses no edema NeurologicCranial nerves II through XII are grossly intact 5 out of 5 strength bilateral upper and lower extremity. Finger to nose within normal limits heel to shin within normal limits, speech is normal with no word finding difficulty or dysarthria, reflexes symmetric, pupils are equally round and reactive to light, there is no pronator drift, sensation is normal, vision is intact to confrontation, gait is deferred, there is no nystagmus, normal neurologic exam NIH stroke scale 0 Skin:  Skin is warm, dry and intact. No rash noted. Psychiatric: Mood and affect are normal. Speech and behavior are normal.  ____________________________________________   LABS (all labs ordered are listed, but only abnormal results are displayed)  Labs Reviewed  GLUCOSE,  CAPILLARY - Abnormal; Notable for the following:       Result Value   Glucose-Capillary 139 (*)    All other components within normal limits  COMPREHENSIVE METABOLIC PANEL  CBC WITH DIFFERENTIAL/PLATELET  PROTIME-INR  ETHANOL   ____________________________________________  EKG  I personally interpreted any EKGs ordered by me or triage Normal sinus rhythm rate 62 bpm no acute ST elevation or acute ST depression normal axis unremarkable EKG ____________________________________________  RADIOLOGY  I reviewed any imaging ordered by me or triage that were performed during my shift and, if possible, patient and/or family made aware of any abnormal findings. ____________________________________________   PROCEDURES  Procedure(s) performed: None  Procedures  Critical Care performed: None  ____________________________________________   INITIAL IMPRESSION / ASSESSMENT AND PLAN / ED COURSE  Pertinent labs & imaging results that were available during my care of the patient were reviewed  by me and considered in my medical decision making (see chart for details).  Patient presents with true vertigo symptoms since 5:00 this morning. She is now 12 hours and her symptoms. Low suspicion for CVA but even if unfortunately this were a CVA, patient is not a candidate for TPA given onset of symptoms when she woke up this morning making her symptoms at least 71 hours old and possibly even 45 hours old. Patient has a NIH stroke scale of 0. She is quite anxious but otherwise air is no acute pathology noted. At this time she states her vertigo has resolved, when I move her head, she has no sensation of motion. However, she was having a true sensation of spinning earlier today. We are reassured by the negative CT scan which is what is anticipated. I do not think that the tingling in her jar represented an acute CVA or ACS. EKG is reassuring. She has had no chest pain or shortness of breath. We will give  her IV fluids, Antivert, and we will observe her closely. If there seems to be persistent symptoms she may benefit from further imaging but at this time, patient is simply vertigo which has largely resolved, to the extent that we can determine  ----------------------------------------- 6:46 PM on 06/23/2016 -----------------------------------------  Patient has no symptoms after Antivert. Discussed staying for an MRI but she declines at this time,. The only reason this would be done is because of her age. Patient has all the symptoms of peripheral vertigo. It is only when she changes position, it is fatigable, she has no other cerebellar signs, and at this time it is completely resolved with Antivert with a normal gait. She would like to go home. She is here to the department. We will have her follow closely with ENT, whom she already sees, as well as neurology. Patient did have a "chill" on her cheek and arm but there is never been any numbness or weakness there, again very low suspicion for central mediated process again have offered MRI patient would prefer to go home and have an outpatient study and she is neurologically intact at discharge.    Clinical Course   ____________________________________________   FINAL CLINICAL IMPRESSION(S) / ED DIAGNOSES  Final diagnoses:  None      This chart was dictated using voice recognition software.  Despite best efforts to proofread,  errors can occur which can change meaning.      Schuyler Amor, MD 06/23/16 [place], MD 06/23/16 [place], MD 06/23/16 [phone]

## 2016-06-23 NOTE — ED Triage Notes (Signed)
Patient comes into the ED via POV c/o dizziness and arm numbness/tingling.  Patient states she woke up this morning around 5:00 and automatically woke up dizzy with the room spinning.  Patient fell back onto the bed.  Patient started having the arm tingling sensation around 1620.  Patient presents with unsteady gait but with even and unlabored respirations.  Patient presents with negative stroke scale at this time.

## 2016-06-23 NOTE — Telephone Encounter (Signed)
[facility] Patient Name: Lauren Callahan DOB: 1955-11-06 Initial Comment Caller states having extreme dizziness, loss of balance, and trouble standing. Nurse Assessment Nurse: [name] RN, [name] Date/Time (Eastern Time): 06/23/2016 3:56:36 PM Confirm and document reason for call. If symptomatic, describe symptoms. You must click the next button to save text entered. ---PT has dizziness - vertigo and loosing balance onset 5 am. Some nausea. About 3 episodes of being "swimmy headed". Took a nap and got up slow and was again so dizzy . Several periods of trying to get up out of bed at facial appointment where she had to flop back on bed 3x more then finally made it home. - Mostly when she is trying to get out of bed or leaning over. No fever Has the patient traveled out of the country within the last 30 days? ---No Does the patient have any new or worsening symptoms? ---Yes Will a triage be completed? ---Yes Related visit to physician within the last 2 weeks? ---No Does the PT have any chronic conditions? (i.e. diabetes, asthma, etc.) ---Yes List chronic conditions. ---see abovenote about polyps Is this a behavioral health or substance abuse call? ---No Guidelines Guideline Title Affirmed Question Affirmed Notes Dizziness - Vertigo Patient sounds very sick or weak to the triager Final Disposition User Go to ED Now (or PCP triage) [name], RN, [name] Comments she is getting ready for colonoscopy and last week it was 130/95 - FYI per pt - has not had prep yet - she has had polyps in the past - no rectal bleeding - so they are doing 3rd scope in 65 y. polyps in the stomach and colon no bleeding from anywhere she feels shakey right now brother will take her - told her if she cant get ride call 911Called office to clarify which [name] pt goes to - reached [place] in office said she has  not seen MD since MD left - she said put it under [name]. pt used to go to Dr [name] and has not been back since this MD left so [name] said attach [name] to record. PerComments [name]: PLEASE GIVE THIS REPORT TO NURSE [name] not dizzy now is why said go to ER. But so many episodes to her get someone to take her to ER now Told her if she gets to where she cant get up again call 911 - she says she is driving herself. Now she says she is tingly on R jaw. Told her to pull over and call 911 - and she is declining to call 911. - she is almost there. Stayed on the phone with her till she arrived. [name] ED[facility]Comply: Comply Call Id: [id number] Comments [name] in office: PLEASE GIVE THIS REPORT TO [name]

## 2016-06-23 NOTE — ED Notes (Signed)
Patient ambulated through the halls without any difficulty or unsteady gait.  Patient states she feels comfortable to go home.  MD notified.

## 2016-06-23 NOTE — Telephone Encounter (Signed)
FYI Pt was transferred to the triage line due to dizziness  She has been dizzy through out the day, at random times, she had had trouble with balance and standing. Pt contact [phone]

## 2016-06-23 NOTE — Discharge Instructions (Signed)
Follow closely with ENT, as well as neurology. He would prefer not to stay for an MRI at this time which is certainly not unreasonable but does somewhat limit what we can evaluate you for.  At this time we have low suspicion for stroke. Take the Antivert as needed for vertigo. If you have any numbness, weakness, difficulty walking difficulty talking or any other neurologic signs return immediately to the emergency department.

## 2016-07-14 ENCOUNTER — Encounter: Payer: Self-pay | Admitting: Family Medicine

## 2016-07-14 LAB — HM MAMMOGRAPHY

## 2016-08-23 ENCOUNTER — Encounter: Payer: Self-pay | Admitting: Surgical

## 2017-03-22 IMAGING — CT CT HEAD W/O CM
3 series · 15 of 47 positions shown, 18 images · non-contrast
Comparison: None.

CLINICAL DATA: 60-year-old female with acute right arm and jaw
tingling. Code stroke.

EXAM:
CT HEAD WITHOUT CONTRAST
TECHNIQUE: Contiguous axial images were obtained from the base of the skull
through the vertex without intravenous contrast.

[Series 2: head wo · axial · 0.43mm/px · z∈[+532,+662]mm · 9 of 32 slices shown, 12 images]
[im 3/32  brain]
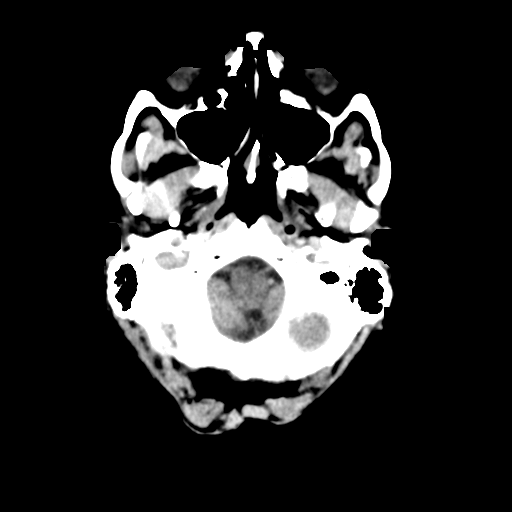
[im 3/32  bone]
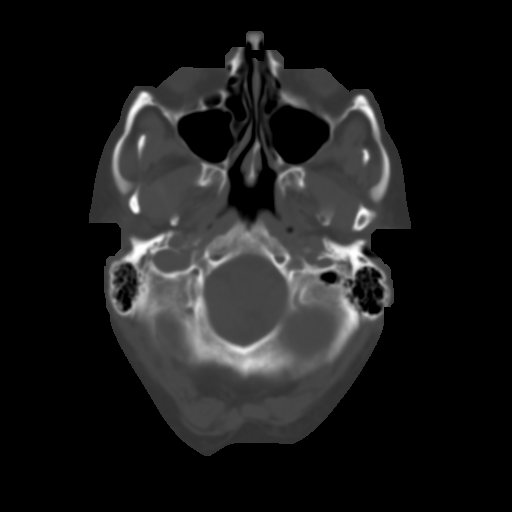
[im 6/32  brain]
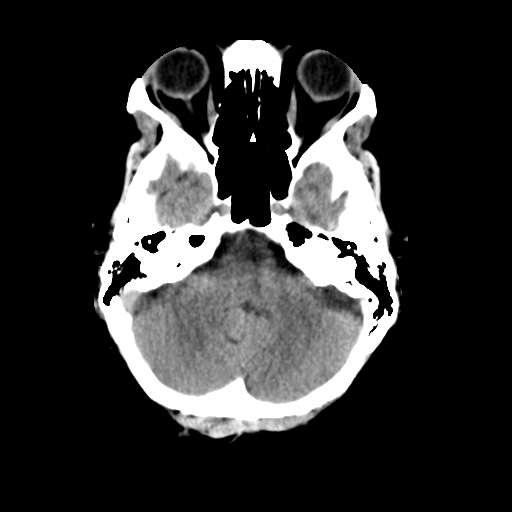
[im 9/32  brain]
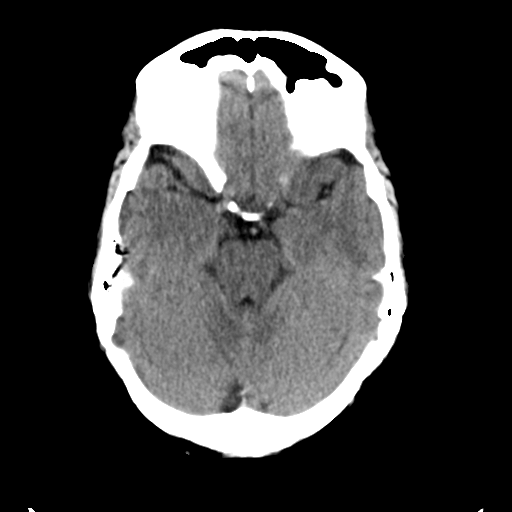
[im 12/32  brain]
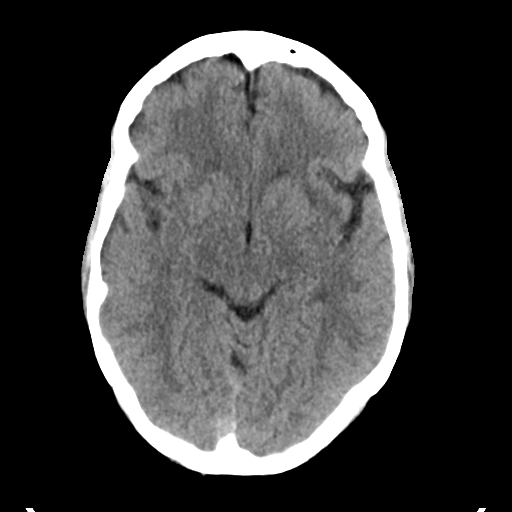
[im 17/32  brain]
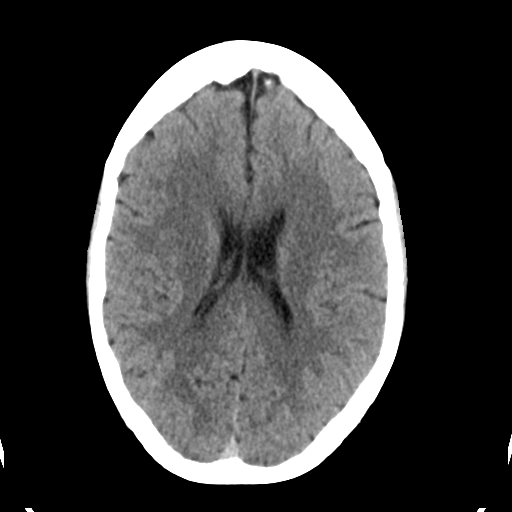
[im 17/32  bone]
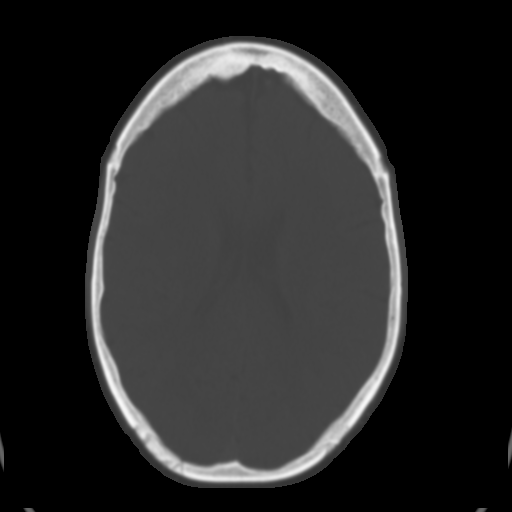
[im 20/32  brain]
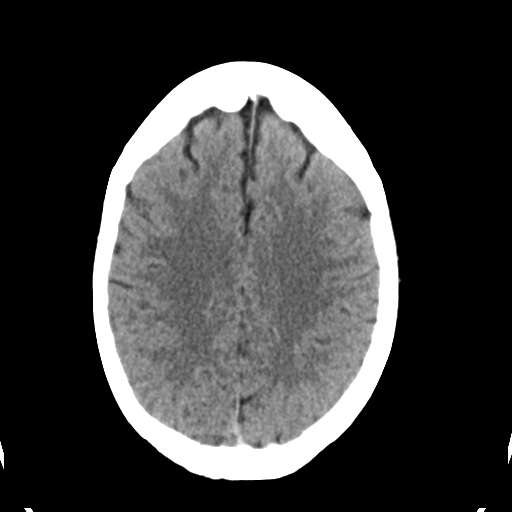
[im 23/32  brain]
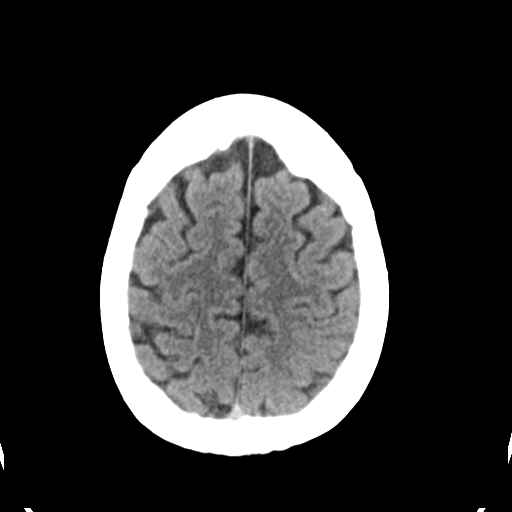
[im 26/32  brain]
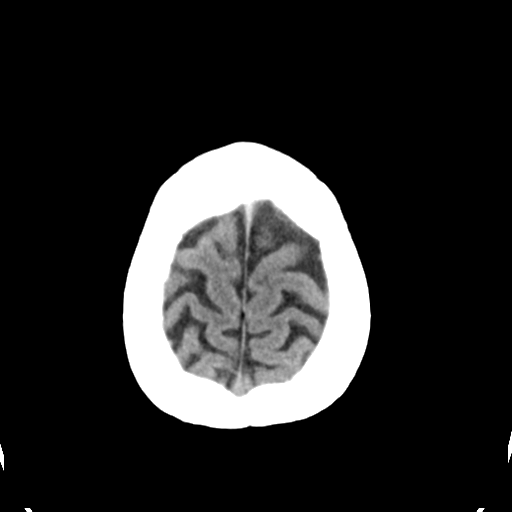
[im 29/32  brain]
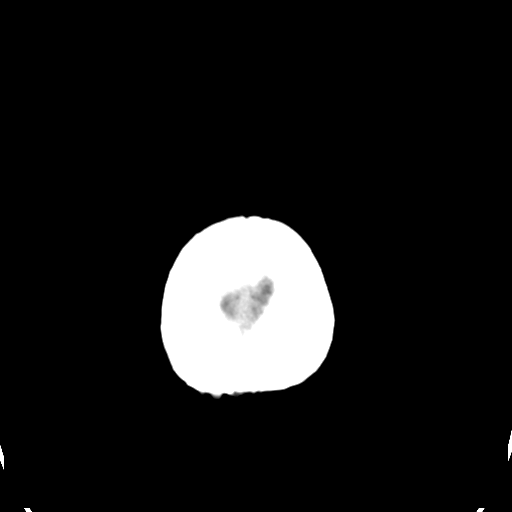
[im 29/32  bone]
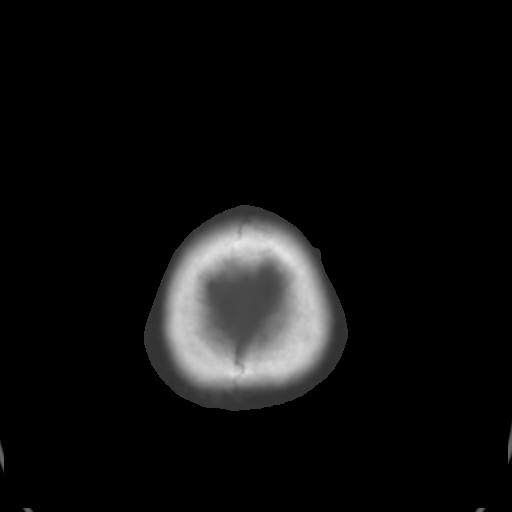

[Series 4: coronal soft tissue · coronal · 0.31mm/px · 3 of 66 slices shown]
[im 22/66  brain]
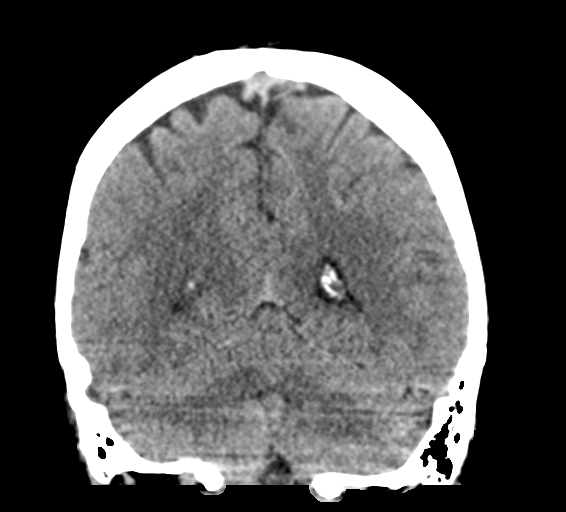
[im 29/66  brain]
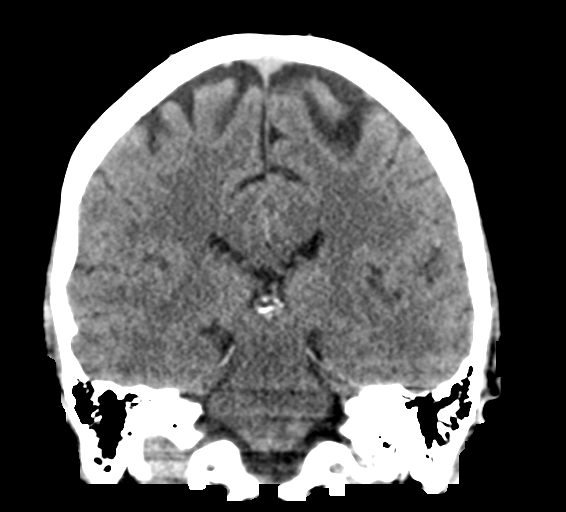
[im 37/66  brain]
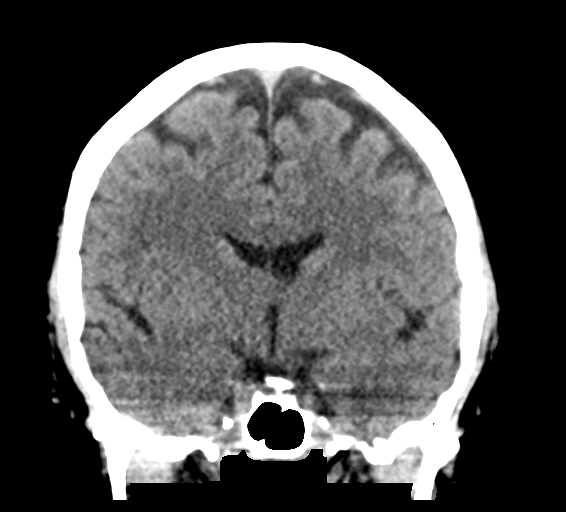

[Series 5: sagittal soft tissue · sagittal · 0.32mm/px · 3 of 51 slices shown]
[im 17/51  brain]
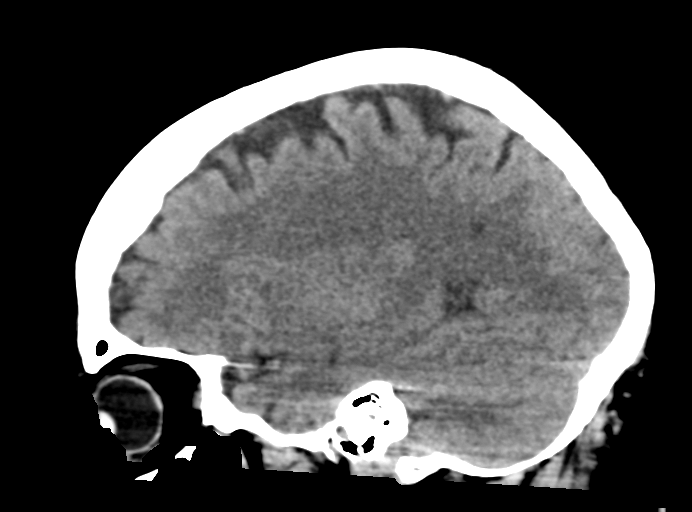
[im 26/51  brain]
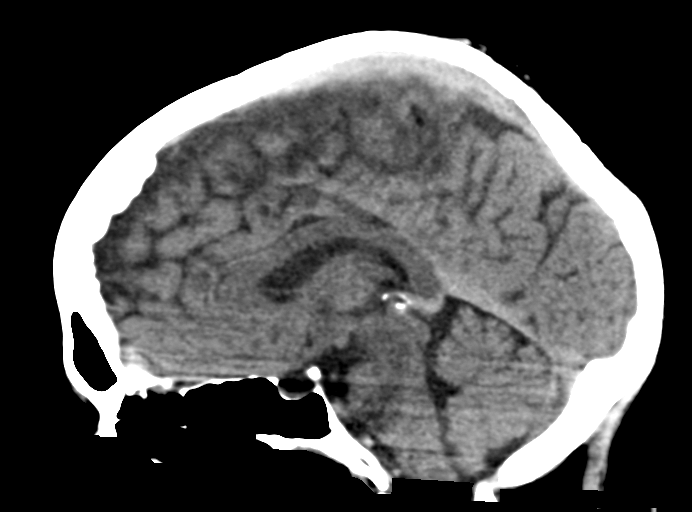
[im 34/51  brain]
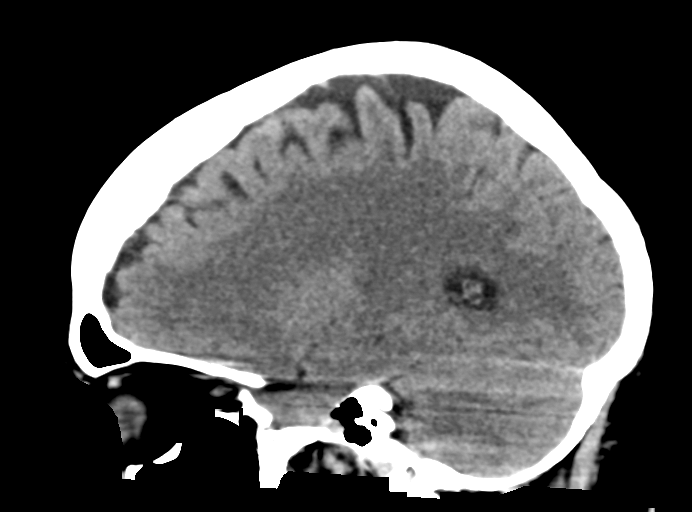

[15 of 47 positions shown; findings below may reference images not displayed]

FINDINGS: Brain: No intracranial abnormalities are identified, including mass
lesion or mass effect, hydrocephalus, extra-axial fluid collection,
midline shift, hemorrhage, or acute infarction.

Vascular: No hyperdense vessel or unexpected calcification.

Skull: Normal. Negative for fracture or focal lesion.

Sinuses/Orbits: No acute finding.

Other: None.
IMPRESSION: Unremarkable noncontrast head CT.

Critical Value/emergent results were called by telephone at the time
of interpretation on 06/23/2016 at [DATE] to Dr. YI LIANG FERGUSON ,
who verbally acknowledged these results.

## 2019-01-17 DIAGNOSIS — Z8719 Personal history of other diseases of the digestive system: Secondary | ICD-10-CM | POA: Insufficient documentation

## 2019-01-17 HISTORY — DX: Personal history of other diseases of the digestive system: Z87.19

## 2019-03-20 ENCOUNTER — Telehealth: Payer: Self-pay

## 2019-03-20 NOTE — Telephone Encounter (Signed)
Copied from [place] [id number]. Topic: Appointment Scheduling - New Patient >> Mar 20, 2019 12:06 PM Alanda Slim E wrote: New patient would like to be scheduled for your office. Provider: Mable Paris  Pt called to re-establish care with margaret / she was a patient here a while back with another provider that is no longer in office/ please advise   Route to department's PEC pool.

## 2019-05-15 DIAGNOSIS — K317 Polyp of stomach and duodenum: Secondary | ICD-10-CM | POA: Diagnosis not present

## 2019-05-16 DIAGNOSIS — K317 Polyp of stomach and duodenum: Secondary | ICD-10-CM | POA: Diagnosis not present

## 2019-05-16 DIAGNOSIS — Z8719 Personal history of other diseases of the digestive system: Secondary | ICD-10-CM | POA: Diagnosis not present

## 2019-05-16 DIAGNOSIS — Z01812 Encounter for preprocedural laboratory examination: Secondary | ICD-10-CM | POA: Diagnosis not present

## 2019-05-16 DIAGNOSIS — K219 Gastro-esophageal reflux disease without esophagitis: Secondary | ICD-10-CM | POA: Diagnosis not present

## 2019-05-23 ENCOUNTER — Ambulatory Visit: Admit: 2019-05-23 | Payer: BLUE CROSS/BLUE SHIELD | Admitting: Internal Medicine

## 2019-05-23 DIAGNOSIS — K2950 Unspecified chronic gastritis without bleeding: Secondary | ICD-10-CM | POA: Diagnosis not present

## 2019-05-23 DIAGNOSIS — K3189 Other diseases of stomach and duodenum: Secondary | ICD-10-CM | POA: Diagnosis not present

## 2019-05-23 DIAGNOSIS — K317 Polyp of stomach and duodenum: Secondary | ICD-10-CM | POA: Diagnosis not present

## 2019-05-23 DIAGNOSIS — K219 Gastro-esophageal reflux disease without esophagitis: Secondary | ICD-10-CM | POA: Diagnosis not present

## 2019-05-23 DIAGNOSIS — D175 Benign lipomatous neoplasm of intra-abdominal organs: Secondary | ICD-10-CM | POA: Diagnosis not present

## 2019-05-23 SURGERY — ESOPHAGOGASTRODUODENOSCOPY (EGD) WITH PROPOFOL
Anesthesia: General

## 2019-05-24 DIAGNOSIS — R220 Localized swelling, mass and lump, head: Secondary | ICD-10-CM | POA: Diagnosis not present

## 2019-05-24 DIAGNOSIS — T148XXA Other injury of unspecified body region, initial encounter: Secondary | ICD-10-CM | POA: Diagnosis not present

## 2019-05-27 ENCOUNTER — Encounter: Payer: Self-pay | Admitting: Primary Care

## 2019-05-27 ENCOUNTER — Ambulatory Visit (INDEPENDENT_AMBULATORY_CARE_PROVIDER_SITE_OTHER): Payer: Medicare HMO | Admitting: Primary Care

## 2019-05-27 ENCOUNTER — Other Ambulatory Visit: Payer: Self-pay

## 2019-05-27 VITALS — BP 140/72 | HR 78 | Temp 97.9°F | Ht 65.0 in | Wt 190.8 lb

## 2019-05-27 DIAGNOSIS — R7989 Other specified abnormal findings of blood chemistry: Secondary | ICD-10-CM

## 2019-05-27 DIAGNOSIS — R7303 Prediabetes: Secondary | ICD-10-CM | POA: Diagnosis not present

## 2019-05-27 DIAGNOSIS — Z23 Encounter for immunization: Secondary | ICD-10-CM

## 2019-05-27 DIAGNOSIS — Z1159 Encounter for screening for other viral diseases: Secondary | ICD-10-CM

## 2019-05-27 DIAGNOSIS — E781 Pure hyperglyceridemia: Secondary | ICD-10-CM | POA: Diagnosis not present

## 2019-05-27 DIAGNOSIS — R030 Elevated blood-pressure reading, without diagnosis of hypertension: Secondary | ICD-10-CM

## 2019-05-27 DIAGNOSIS — R945 Abnormal results of liver function studies: Secondary | ICD-10-CM

## 2019-05-27 LAB — LIPID PANEL
Cholesterol: 179 mg/dL (ref 0–200)
HDL: 50.7 mg/dL (ref >39.00)
LDL Cholesterol: 109 mg/dL — ABNORMAL HIGH (ref 0–99)
NonHDL: 128.45
Total CHOL/HDL Ratio: 4
Triglycerides: 95 mg/dL (ref 0.0–149.0)
VLDL: 19 mg/dL (ref 0.0–40.0)

## 2019-05-27 LAB — C-REACTIVE PROTEIN: CRP: <1 mg/dL (ref 0.5–20.0)

## 2019-05-27 LAB — COMPREHENSIVE METABOLIC PANEL
ALT: 25 U/L (ref 0–35)
AST: 19 U/L (ref 0–37)
Albumin: 4.4 g/dL (ref 3.5–5.2)
Alkaline Phosphatase: 64 U/L (ref 39–117)
BUN: 13 mg/dL (ref 6–23)
CO2: 27 mEq/L (ref 19–32)
Calcium: 9.8 mg/dL (ref 8.4–10.5)
Chloride: 107 mEq/L (ref 96–112)
Creatinine, Ser: 0.67 mg/dL (ref 0.40–1.20)
GFR: 88.34 mL/min (ref >60.00)
Glucose, Bld: 89 mg/dL (ref 70–99)
Potassium: 4.7 mEq/L (ref 3.5–5.1)
Sodium: 142 mEq/L (ref 135–145)
Total Bilirubin: 0.6 mg/dL (ref 0.2–1.2)
Total Protein: 6.6 g/dL (ref 6.0–8.3)

## 2019-05-27 LAB — HEMOGLOBIN A1C: Hgb A1c MFr Bld: 5.6 % (ref 4.6–6.5)

## 2019-05-27 MED ORDER — ZOSTER VAC RECOMB ADJUVANTED 50 MCG/0.5ML IM SUSR: 0.5000 mL | Freq: Once | INTRAMUSCULAR | 1 refills | Status: AC

## 2019-05-27 NOTE — Patient Instructions (Addendum)
Stop by the lab prior to leaving today. I will notify you of your results once received.   Start exercising. You should be getting 150 minutes of moderate intensity exercise weekly.  Continue to work on a healthy diet. Ensure you are consuming 64 ounces of water daily.  Monitor your blood pressure. I would like to see you between 120-130/70's-80's. You should be below 140/90.  Take the shingles vaccination to your pharmacy for administration.   It was a pleasure to meet you today! Please don't hesitate to call or message me with any questions. Welcome to [facility]!

## 2019-05-27 NOTE — Assessment & Plan Note (Signed)
Repeat LFTs pending. 

## 2019-05-27 NOTE — Assessment & Plan Note (Signed)
Endorses weight loss through healthy diet and regular exercise. Encouraged to continue. Repeat lipids pending.

## 2019-05-27 NOTE — Progress Notes (Signed)
Subjective:    Patient ID: Lauren Callahan, female    DOB: 1956-01-18, 63 y.o.   MRN: [id number]  HPI  Lauren Callahan is a 63 year old female who presents today to establish care and discuss the problems mentioned below. Will obtain/review records. She would like the Shingrix vaccination prescription and flu shot today.  1) Elevated Blood Pressure Reading: She is working on lifestyle changes including cutting back on sugar and alcohol, eating more whole foods rather than processed foods. She is not exercising.   She does not check her blood pressure at home. She underwent endoscopy last week and endorses it was a little "high".   BP Readings from Last 3 Encounters:  05/27/19 140/72  06/23/16 (!) 144/75  12/02/15 107/71   Wt Readings from Last 3 Encounters:  05/27/19 190 lb 12 oz (86.5 kg)  06/23/16 190 lb (86.2 kg)  11/06/15 206 lb 8 oz (93.7 kg)   2) Polyps: Located to the stomach. Following with GI through [facility], underwent endoscopy last week. She was provided with a prescription of omeprazole 40 mg BID but has not yet taken. She would like to hold off for now and will notify when she's ready to take.  Review of Systems  Eyes: Negative for visual disturbance.  Respiratory: Negative for shortness of breath.   Cardiovascular: Negative for chest pain.  Neurological: Negative for dizziness and headaches.       Past Medical History:  Diagnosis Date  . Benign fundic gland polyps of stomach   . Breast mass   . Clotting disorder (HCC)    left leg  . Depression   . Endocervical polyp   . Endometrial polyp   . Endometriosis   . Fibroid   . Gastric polyp    Dr. Tiffany Kocher  . GERD (gastroesophageal reflux disease)   . Menorrhagia      Social History   Socioeconomic History  . Marital status: Married    Spouse name: Not on file  . Number of children: Not on file  . Years of education: Not on file  . Highest education level: Not on file  Occupational History  . Not on  file  Social Needs  . Financial resource strain: Not on file  . Food insecurity    Worry: Not on file    Inability: Not on file  . Transportation needs    Medical: Not on file    Non-medical: Not on file  Tobacco Use  . Smoking status: Never Smoker  . Smokeless tobacco: Never Used  Substance and Sexual Activity  . Alcohol use: Yes    Comment: occassionally  . Drug use: No  . Sexual activity: Yes    Birth control/protection: Surgical  Lifestyle  . Physical activity    Days per week: Not on file    Minutes per session: Not on file  . Stress: Not on file  Relationships  . Social Herbalist on phone: Not on file    Gets together: Not on file    Attends religious service: Not on file    Active member of club or organization: Not on file    Attends meetings of clubs or organizations: Not on file    Relationship status: Not on file  . Intimate partner violence    Fear of current or ex partner: Not on file    Emotionally abused: Not on file    Physically abused: Not on file    Forced sexual  activity: Not on file  Other Topics Concern  . Not on file  Social History Narrative   Lives in [place] with husband.      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 DILATION AND CURETTAGE OF UTERUS    . HYSTEROSCOPY    . 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 Carlis Abbott  . TUBAL LIGATION    . VAGINAL HYSTERECTOMY  2001   [facility]  Left Salpingectomy    Family History  Problem Relation Age of Onset  . Diabetes Mother   . Diabetes Father   . Hypertension Father   . Heart disease Father        heart attacks   . Heart attack Father   . Diabetes Maternal Grandfather     Allergies  Allergen Reactions  . Macrodantin [Nitrofurantoin Macrocrystal] Itching and Rash    No current outpatient medications on file prior to visit.   No current  facility-administered medications on file prior to visit.     BP 140/72   Pulse 78   Temp 97.9 F (36.6 C) (Temporal)   Ht 5\' 5"  (1.651 m)   Wt 190 lb 12 oz (86.5 kg)   SpO2 98%   BMI 31.74 kg/m    Objective:   Physical Exam  Constitutional: She appears well-nourished.  Neck: Neck supple.  Cardiovascular: Normal rate and regular rhythm.  Respiratory: Effort normal and breath sounds normal.  Skin: Skin is warm and dry.  Psychiatric: She has a normal mood and affect.           Assessment & Plan:

## 2019-05-27 NOTE — Addendum Note (Signed)
Addended by: Jacqualin Combes on: 05/27/2019 12:30 PM   Modules accepted: Orders

## 2019-05-27 NOTE — Assessment & Plan Note (Signed)
Noted on chart from 2017 and also today. Recommended she continue to work on lifestyle changes. We will plan to see her back in 3 months for blood pressure check.

## 2019-05-27 NOTE — Assessment & Plan Note (Signed)
A1C of 5.9 and 5.7 in 2017. Repeat A1C pending. Encouraged continued weight loss through diet and exercise.

## 2019-05-28 LAB — HEPATITIS C ANTIBODY
Hepatitis C Ab: NONREACTIVE
SIGNAL TO CUT-OFF: 0.01 (ref <1.00)

## 2019-06-13 DIAGNOSIS — Z1231 Encounter for screening mammogram for malignant neoplasm of breast: Secondary | ICD-10-CM | POA: Diagnosis not present

## 2019-06-13 DIAGNOSIS — Z803 Family history of malignant neoplasm of breast: Secondary | ICD-10-CM | POA: Diagnosis not present

## 2019-06-13 LAB — HM MAMMOGRAPHY

## 2019-06-20 ENCOUNTER — Encounter: Payer: Self-pay | Admitting: Primary Care

## 2019-06-25 DIAGNOSIS — R69 Illness, unspecified: Secondary | ICD-10-CM | POA: Diagnosis not present

## 2019-07-05 DIAGNOSIS — K317 Polyp of stomach and duodenum: Secondary | ICD-10-CM | POA: Diagnosis not present

## 2019-07-05 DIAGNOSIS — K2950 Unspecified chronic gastritis without bleeding: Secondary | ICD-10-CM | POA: Diagnosis not present

## 2019-08-06 DIAGNOSIS — H35363 Drusen (degenerative) of macula, bilateral: Secondary | ICD-10-CM | POA: Diagnosis not present

## 2019-08-27 ENCOUNTER — Ambulatory Visit: Payer: Medicare HMO | Admitting: Primary Care

## 2019-09-19 DIAGNOSIS — Z20828 Contact with and (suspected) exposure to other viral communicable diseases: Secondary | ICD-10-CM | POA: Diagnosis not present

## 2019-11-15 DIAGNOSIS — D225 Melanocytic nevi of trunk: Secondary | ICD-10-CM | POA: Diagnosis not present

## 2019-11-15 DIAGNOSIS — L821 Other seborrheic keratosis: Secondary | ICD-10-CM | POA: Diagnosis not present

## 2019-11-15 DIAGNOSIS — D2272 Melanocytic nevi of left lower limb, including hip: Secondary | ICD-10-CM | POA: Diagnosis not present

## 2019-11-15 DIAGNOSIS — D2271 Melanocytic nevi of right lower limb, including hip: Secondary | ICD-10-CM | POA: Diagnosis not present

## 2019-11-15 DIAGNOSIS — D2262 Melanocytic nevi of left upper limb, including shoulder: Secondary | ICD-10-CM | POA: Diagnosis not present

## 2019-11-15 DIAGNOSIS — D2261 Melanocytic nevi of right upper limb, including shoulder: Secondary | ICD-10-CM | POA: Diagnosis not present

## 2020-04-23 DIAGNOSIS — R69 Illness, unspecified: Secondary | ICD-10-CM | POA: Diagnosis not present

## 2020-04-30 DIAGNOSIS — R69 Illness, unspecified: Secondary | ICD-10-CM | POA: Diagnosis not present

## 2020-05-03 ENCOUNTER — Other Ambulatory Visit: Payer: Self-pay | Admitting: Primary Care

## 2020-05-03 DIAGNOSIS — Z114 Encounter for screening for human immunodeficiency virus [HIV]: Secondary | ICD-10-CM

## 2020-05-03 DIAGNOSIS — E781 Pure hyperglyceridemia: Secondary | ICD-10-CM

## 2020-05-03 DIAGNOSIS — R7303 Prediabetes: Secondary | ICD-10-CM

## 2020-05-12 DIAGNOSIS — R69 Illness, unspecified: Secondary | ICD-10-CM | POA: Diagnosis not present

## 2020-05-13 ENCOUNTER — Other Ambulatory Visit (INDEPENDENT_AMBULATORY_CARE_PROVIDER_SITE_OTHER): Payer: Medicare HMO

## 2020-05-13 ENCOUNTER — Other Ambulatory Visit: Payer: Self-pay

## 2020-05-13 DIAGNOSIS — R7303 Prediabetes: Secondary | ICD-10-CM

## 2020-05-13 DIAGNOSIS — R69 Illness, unspecified: Secondary | ICD-10-CM | POA: Diagnosis not present

## 2020-05-13 DIAGNOSIS — E781 Pure hyperglyceridemia: Secondary | ICD-10-CM

## 2020-05-13 DIAGNOSIS — Z114 Encounter for screening for human immunodeficiency virus [HIV]: Secondary | ICD-10-CM

## 2020-05-13 LAB — COMPREHENSIVE METABOLIC PANEL
ALT: 17 U/L (ref 0–35)
AST: 18 U/L (ref 0–37)
Albumin: 4.3 g/dL (ref 3.5–5.2)
Alkaline Phosphatase: 56 U/L (ref 39–117)
BUN: 13 mg/dL (ref 6–23)
CO2: 28 mEq/L (ref 19–32)
Calcium: 9.4 mg/dL (ref 8.4–10.5)
Chloride: 108 mEq/L (ref 96–112)
Creatinine, Ser: 0.67 mg/dL (ref 0.40–1.20)
GFR: 88.07 mL/min (ref >60.00)
Glucose, Bld: 92 mg/dL (ref 70–99)
Potassium: 4.5 mEq/L (ref 3.5–5.1)
Sodium: 142 mEq/L (ref 135–145)
Total Bilirubin: 0.7 mg/dL (ref 0.2–1.2)
Total Protein: 6.6 g/dL (ref 6.0–8.3)

## 2020-05-13 LAB — CBC
HCT: 43 % (ref 36.0–46.0)
Hemoglobin: 14 g/dL (ref 12.0–15.0)
MCHC: 32.5 g/dL (ref 30.0–36.0)
MCV: 94.4 fl (ref 78.0–100.0)
Platelets: 267 10*3/uL (ref 150.0–400.0)
RBC: 4.56 Mil/uL (ref 3.87–5.11)
RDW: 13.9 % (ref 11.5–15.5)
WBC: 6.9 10*3/uL (ref 4.0–10.5)

## 2020-05-13 LAB — LIPID PANEL
Cholesterol: 199 mg/dL (ref 0–200)
HDL: 55.1 mg/dL (ref >39.00)
LDL Cholesterol: 120 mg/dL — ABNORMAL HIGH (ref 0–99)
NonHDL: 143.81
Total CHOL/HDL Ratio: 4
Triglycerides: 120 mg/dL (ref 0.0–149.0)
VLDL: 24 mg/dL (ref 0.0–40.0)

## 2020-05-13 LAB — HEMOGLOBIN A1C: Hgb A1c MFr Bld: 5.3 % (ref 4.6–6.5)

## 2020-05-14 LAB — HIV ANTIBODY (ROUTINE TESTING W REFLEX): HIV 1&2 Ab, 4th Generation: NONREACTIVE

## 2020-05-20 ENCOUNTER — Encounter: Payer: Self-pay | Admitting: Primary Care

## 2020-05-20 ENCOUNTER — Ambulatory Visit (INDEPENDENT_AMBULATORY_CARE_PROVIDER_SITE_OTHER): Payer: Medicare HMO | Admitting: Primary Care

## 2020-05-20 ENCOUNTER — Other Ambulatory Visit: Payer: Self-pay

## 2020-05-20 VITALS — BP 122/84 | HR 73 | Ht 65.0 in | Wt 180.1 lb

## 2020-05-20 DIAGNOSIS — G8929 Other chronic pain: Secondary | ICD-10-CM

## 2020-05-20 DIAGNOSIS — Z1231 Encounter for screening mammogram for malignant neoplasm of breast: Secondary | ICD-10-CM

## 2020-05-20 DIAGNOSIS — R7303 Prediabetes: Secondary | ICD-10-CM

## 2020-05-20 DIAGNOSIS — E781 Pure hyperglyceridemia: Secondary | ICD-10-CM | POA: Diagnosis not present

## 2020-05-20 DIAGNOSIS — R69 Illness, unspecified: Secondary | ICD-10-CM | POA: Diagnosis not present

## 2020-05-20 DIAGNOSIS — K317 Polyp of stomach and duodenum: Secondary | ICD-10-CM | POA: Insufficient documentation

## 2020-05-20 DIAGNOSIS — M25561 Pain in right knee: Secondary | ICD-10-CM | POA: Diagnosis not present

## 2020-05-20 DIAGNOSIS — M25562 Pain in left knee: Secondary | ICD-10-CM

## 2020-05-20 DIAGNOSIS — Z23 Encounter for immunization: Secondary | ICD-10-CM | POA: Diagnosis not present

## 2020-05-20 DIAGNOSIS — Z0001 Encounter for general adult medical examination with abnormal findings: Secondary | ICD-10-CM | POA: Insufficient documentation

## 2020-05-20 DIAGNOSIS — E2839 Other primary ovarian failure: Secondary | ICD-10-CM

## 2020-05-20 DIAGNOSIS — Z0000 Encounter for general adult medical examination without abnormal findings: Secondary | ICD-10-CM | POA: Diagnosis not present

## 2020-05-20 DIAGNOSIS — R030 Elevated blood-pressure reading, without diagnosis of hypertension: Secondary | ICD-10-CM | POA: Diagnosis not present

## 2020-05-20 DIAGNOSIS — H269 Unspecified cataract: Secondary | ICD-10-CM | POA: Diagnosis not present

## 2020-05-20 DIAGNOSIS — F4323 Adjustment disorder with mixed anxiety and depressed mood: Secondary | ICD-10-CM | POA: Diagnosis not present

## 2020-05-20 MED ORDER — ZOSTER VAC RECOMB ADJUVANTED 50 MCG/0.5ML IM SUSR: 0.5000 mL | Freq: Once | INTRAMUSCULAR | 1 refills | Status: AC

## 2020-05-20 NOTE — Assessment & Plan Note (Signed)
Chronic and stable, overall improved.

## 2020-05-20 NOTE — Assessment & Plan Note (Signed)
History of recurrent gastric polyps with removal. Following with GI.

## 2020-05-20 NOTE — Assessment & Plan Note (Signed)
Resolved.  Commended her on improvements in diet. Encouraged regular exercise.

## 2020-05-20 NOTE — Addendum Note (Signed)
Addended by: Amado Coe on: 05/20/2020 11:07 AM   Modules accepted: Orders

## 2020-05-20 NOTE — Assessment & Plan Note (Signed)
Denies concerns for anxiety and depression.  Continue to monitor.  

## 2020-05-20 NOTE — Progress Notes (Addendum)
Patient ID: Lauren Callahan, female   DOB: 1955/11/23, 64 y.o.   MRN: [id number]  Ms. Lauren Callahan is a 64 year old female who presents today for  Complete physical.  HPI:  Past Medical History:  Diagnosis Date  . Benign fundic gland polyps of stomach   . Breast mass   . Clotting disorder (HCC)    left leg  . Depression   . Endocervical polyp   . Endometrial polyp   . Endometriosis   . Fibroid   . Gastric polyp    Dr. Tiffany Kocher  . GERD (gastroesophageal reflux disease)   . Menorrhagia   . Tongue lesion 11/06/2015    Current Outpatient Medications  Medication Sig Dispense Refill  . fexofenadine (ALLEGRA) 180 MG tablet Take 180 mg by mouth as needed for allergies or rhinitis.     No current facility-administered medications for this visit.    Allergies  Allergen Reactions  . Macrodantin [Nitrofurantoin Macrocrystal] Itching and Rash    Family History  Problem Relation Age of Onset  . Diabetes Mother   . Diabetes Father   . Hypertension Father   . Heart disease Father        heart attacks   . Heart attack Father   . Diabetes Maternal Grandfather     Social History   Socioeconomic History  . Marital status: Married    Spouse name: Not on file  . Number of children: Not on file  . Years of education: Not on file  . Highest education level: Not on file  Occupational History  . Not on file  Tobacco Use  . Smoking status: Never Smoker  . Smokeless tobacco: Never Used  Vaping Use  . Vaping Use: Never used  Substance and Sexual Activity  . Alcohol use: Yes    Comment: occassionally  . Drug use: No  . Sexual activity: Yes    Birth control/protection: Surgical  Other Topics Concern  . Not on file  Social History Narrative   Lives in [place] with husband.      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 Difficulty of Paying Living Expenses: Not on  file  Food Insecurity:   . Worried About Charity fundraiser in the Last Year: Not on file  . Ran Out of Food in the Last Year: Not on file  Transportation Needs:   . Lack of Transportation (Medical): Not on file  . Lack of Transportation (Non-Medical): Not on file  Physical Activity:   . Days of Exercise per Week: Not on file  . Minutes of Exercise per Session: Not on file  Stress:   . Feeling of Stress : Not on file  Social Connections:   . Frequency of Communication with Friends and Family: Not on file  . Frequency of Social Gatherings with Friends and Family: Not on file  . Attends Religious Services: Not on file  . Active Member of Clubs or Organizations: Not on file  . Attends Archivist Meetings: Not on file  . Marital Status: Not on file  Intimate Partner Violence:   . Fear of Current or Ex-Partner: Not on file  . Emotionally Abused: Not on file  . Physically Abused: Not on file  . Sexually Abused: Not on file    Hospitiliaztions: None  Health Maintenance:  Flu: Due today  Tetanus: Completed in 2016  Pneumovax: Never completed  Prevnar: Never completed  Shingrix: Never completed   Bone Density: Completed in 2017  Colonoscopy: Completed in 2015  Eye Doctor: Completes annually   Dental Exam: Completes semi-annually   Mammogram: Completed in October 2020  Hep C Screening: Negative     Providers: Alma Friendly, PCP; Dr. Alice Reichert, GI; Dr. Annamaria Helling, Optometry   I have personally reviewed and have noted: 1. The patient's medical and social history 2. Their use of alcohol, tobacco or illicit drugs 3. Their current medications and supplements 4. The patient's functional ability including ADL's, fall risks, home  safety risks and hearing or visual impairment. 5. Diet and physical activities 6. Evidence for depression or mood disorder  Subjective:   Review of Systems:   Constitutional: Denies fever, malaise, fatigue, headache or abrupt weight  changes.  HEENT: Denies eye pain, eye redness, ear pain, ringing in the ears, wax buildup, runny nose, nasal congestion, bloody nose, or sore throat. Respiratory: Denies difficulty breathing, shortness of breath, cough or sputum production.   Cardiovascular: Denies chest pain, chest tightness, palpitations or swelling in the hands or feet.  Gastrointestinal: Denies abdominal pain, bloating, diarrhea or blood in the stool. Following with GI for gastric polyps. Some constipation with Optavia diet. GU: Denies urgency, frequency, pain with urination, burning sensation, blood in urine, odor or discharge. Musculoskeletal: Denies decrease in range of motion, difficulty with gait, muscle pain or joint pain and swelling.  Skin: Denies redness, rashes, lesions or ulcercations. recently completed skin check with dermatology.  Neurological: Denies dizziness, difficulty with memory, difficulty with speech or problems with balance and coordination.  Psychiatric: Denies concerns for anxiety or depression.   No other specific complaints in a complete review of systems (except as listed in HPI above).  BP Readings from Last 3 Encounters:  05/20/20 122/84  05/27/19 140/72  06/23/16 (!) 144/75   The 10-year ASCVD risk score Mikey Bussing DC Jr., et al., 2013) is: 5.1%   Values used to calculate the score:     Age: 27 years     Sex: Female     Is Non-Hispanic African American: No     Diabetic: No     Tobacco smoker: No     Systolic Blood Pressure: 449 mmHg     Is BP treated: No     HDL Cholesterol: 55.1 mg/dL     Total Cholesterol: 199 mg/dL   Objective:  PE:   Ht 5\' 5"  (1.651 m)   Wt 180 lb 1.6 oz (81.7 kg)   BMI 29.97 kg/m  Wt Readings from Last 3 Encounters:  05/20/20 180 lb 1.6 oz (81.7 kg)  05/27/19 190 lb 12 oz (86.5 kg)  06/23/16 190 lb (86.2 kg)    General: Appears their stated age, well developed, well nourished in NAD. Skin: Warm, dry and intact. No rashes, lesions or ulcerations  noted. HEENT: Head: normal shape and size; Eyes: sclera white, no icterus, conjunctiva pink, PERRLA and EOMs intact; Ears: Tm's gray and intact, normal light reflex; Nose: mucosa pink and moist, septum midline; Throat/Mouth: Teeth present, mucosa pink and moist, no exudate, lesions or ulcerations noted.  Neck: Normal range of motion. Neck supple, trachea midline. No massses, lumps or thyromegaly present.  Cardiovascular: Normal rate and rhythm. S1,S2 noted.  No murmur, rubs or gallops noted. No JVD or BLE edema. No carotid bruits noted. Pulmonary/Chest: Normal effort and positive vesicular breath sounds. No respiratory distress. No wheezes, rales  or ronchi noted.  Abdomen: Soft and nontender. Normal bowel sounds, no bruits noted. No distention or masses noted. Liver, spleen and kidneys non palpable. Musculoskeletal: Normal range of motion. No signs of joint swelling. No difficulty with gait.  Neurological: Alert and oriented. Cranial nerves II-XII intact. Coordination normal. +DTRs bilaterally. Psychiatric: Mood and affect normal. Behavior is normal. Judgment and thought content normal.   EKG: NSR with rate of 68. No PAC/PVC noted. No ST elevation. Appears similar to prior ECG from 2017.  BMET    Component Value Date/Time   NA 142 05/13/2020 0839   K 4.5 05/13/2020 0839   CL 108 05/13/2020 0839   CO2 28 05/13/2020 0839   GLUCOSE 92 05/13/2020 0839   BUN 13 05/13/2020 0839   CREATININE 0.67 05/13/2020 0839   CREATININE 0.80 01/09/2013 1046   CALCIUM 9.4 05/13/2020 0839   GFRNONAA >60 06/23/2016 1653   GFRAA >60 06/23/2016 1653    Lipid Panel     Component Value Date/Time   CHOL 199 05/13/2020 0839   TRIG 120.0 05/13/2020 0839   HDL 55.10 05/13/2020 0839   CHOLHDL 4 05/13/2020 0839   VLDL 24.0 05/13/2020 0839   LDLCALC 120 (H) 05/13/2020 0839    CBC    Component Value Date/Time   WBC 6.9 05/13/2020 0839   RBC 4.56 05/13/2020 0839   HGB 14.0 05/13/2020 0839   HCT 43.0  05/13/2020 0839   PLT 267.0 05/13/2020 0839   MCV 94.4 05/13/2020 0839   MCH 31.7 06/23/2016 1653   MCHC 32.5 05/13/2020 0839   RDW 13.9 05/13/2020 0839   LYMPHSABS 3.0 06/23/2016 1653   MONOABS 0.4 06/23/2016 1653   EOSABS 0.1 06/23/2016 1653   BASOSABS 0.1 06/23/2016 1653    Hgb A1C Lab Results  Component Value Date   HGBA1C 5.3 05/13/2020      Assessment and Plan:   Medicare Annual Wellness Visit:  Diet: She endorses a healthy and improved diet.  Physical activity: Sedentary Depression/mood screen: Negative Hearing: Intact to whispered voice Visual acuity: Grossly normal, performs annual eye exam  ADLs: Capable Fall risk: None Home safety: Good Cognitive evaluation: Intact to orientation, naming, recall and repetition EOL planning: Adv directives not complete, packet provided.   Preventative Medicine: Prevnar and influenza vaccines due today. Provided. Rx for Shingrix provided. Mammogram and bone density due, orders placed. Colonoscopy UTD, due in 2025. Commended her on weight loss efforts, encouraged regular exercise. Exam today unremarkable. Labs reviewed. Advanced directives packet provided. All recommendations provided at end of visit. ECG completed today.   Next appointment: One year

## 2020-05-20 NOTE — Assessment & Plan Note (Signed)
Stable on recent labs. ASCVD risk score of 5%.  Discussed the importance of a healthy diet and regular exercise in order for weight loss, and to reduce the risk of any potential medical problems.  Continue to monitor.

## 2020-05-20 NOTE — Assessment & Plan Note (Signed)
Prevnar and influenza vaccines due today. Provided. Rx for Shingrix provided. Mammogram and bone density due, orders placed. Colonoscopy UTD, due in 2025. Commended her on weight loss efforts, encouraged regular exercise. Exam today unremarkable. Labs reviewed. Advanced directives packet provided. All recommendations provided at end of visit. ECG completed today.   I have personally reviewed and have noted: 1. The patient's medical and social history 2. Their use of alcohol, tobacco or illicit drugs 3. Their current medications and supplements 4. The patient's functional ability including ADL's, fall risks, home  safety risks and hearing or visual impairment. 5. Diet and physical activities 6. Evidence for depression or mood disorder

## 2020-05-20 NOTE — Assessment & Plan Note (Signed)
Normotensive in the office today, continue to monitor.  Commended her on weight loss.

## 2020-05-20 NOTE — Patient Instructions (Addendum)
Start exercising. You should be getting 150 minutes of moderate intensity exercise weekly.  Continue to work on a healthy diet. Ensure you are consuming 64 ounces of water daily.  Take the Shingles vaccination prescription to the pharmacy for administration.   [facility] to [facility] your mammogram and bone density scan.  It was a pleasure to see you today!

## 2020-05-21 ENCOUNTER — Other Ambulatory Visit: Payer: Self-pay | Admitting: Primary Care

## 2020-05-21 DIAGNOSIS — Z0000 Encounter for general adult medical examination without abnormal findings: Secondary | ICD-10-CM

## 2020-05-22 ENCOUNTER — Telehealth: Payer: Self-pay | Admitting: Primary Care

## 2020-05-22 NOTE — Telephone Encounter (Signed)
Called patient to schedule nurse visit for hearing and eye test. LVM to call back and schedule due to not being done at [facility].

## 2020-06-01 DIAGNOSIS — R69 Illness, unspecified: Secondary | ICD-10-CM | POA: Diagnosis not present

## 2020-11-24 ENCOUNTER — Ambulatory Visit (INDEPENDENT_AMBULATORY_CARE_PROVIDER_SITE_OTHER): Payer: Medicare Other | Admitting: Primary Care

## 2020-11-24 ENCOUNTER — Other Ambulatory Visit: Payer: Self-pay

## 2020-11-24 ENCOUNTER — Encounter: Payer: Self-pay | Admitting: Primary Care

## 2020-11-24 VITALS — BP 124/82 | HR 76 | Temp 97.8°F | Ht 65.0 in

## 2020-11-24 DIAGNOSIS — R002 Palpitations: Secondary | ICD-10-CM | POA: Insufficient documentation

## 2020-11-24 DIAGNOSIS — R7989 Other specified abnormal findings of blood chemistry: Secondary | ICD-10-CM | POA: Diagnosis not present

## 2020-11-24 LAB — CBC
HCT: 41.9 % (ref 36.0–46.0)
Hemoglobin: 14 g/dL (ref 12.0–15.0)
MCHC: 33.5 g/dL (ref 30.0–36.0)
MCV: 92.5 fl (ref 78.0–100.0)
Platelets: 284 10*3/uL (ref 150.0–400.0)
RBC: 4.53 Mil/uL (ref 3.87–5.11)
RDW: 14 % (ref 11.5–15.5)
WBC: 6.1 10*3/uL (ref 4.0–10.5)

## 2020-11-24 LAB — BASIC METABOLIC PANEL
BUN: 12 mg/dL (ref 6–23)
CO2: 29 mEq/L (ref 19–32)
Calcium: 9.4 mg/dL (ref 8.4–10.5)
Chloride: 108 mEq/L (ref 96–112)
Creatinine, Ser: 0.66 mg/dL (ref 0.40–1.20)
GFR: 91.37 mL/min (ref >60.00)
Glucose, Bld: 99 mg/dL (ref 70–99)
Potassium: 4.7 mEq/L (ref 3.5–5.1)
Sodium: 142 mEq/L (ref 135–145)

## 2020-11-24 LAB — HEMOGLOBIN A1C: Hgb A1c MFr Bld: 5.4 % (ref 4.6–6.5)

## 2020-11-24 LAB — TSH: TSH: 3.71 u[IU]/mL (ref 0.35–4.50)

## 2020-11-24 NOTE — Patient Instructions (Signed)
Stop by the lab prior to leaving today. I will notify you of your results once received.   Limit caffeine and wine consumption. Ensure you are consuming 64 ounces of water daily.  You will be contacted regarding your referral to cardiology.  Please let us know if you have not been contacted within two weeks.   It was a pleasure to see you today!

## 2020-11-24 NOTE — Progress Notes (Signed)
Subjective:    Patient ID: Lauren Callahan, female    DOB: 1956/04/08, 65 y.o.   MRN: [id number]  HPI  Lauren Callahan is a very pleasant 65 y.o. female with a history of anxiety and depression, prediabetes, chronic knee pain who presents today with a chief complaint of palpitations.   She describes her symptoms as a "fluttering feeling that beats hard" for which she notices around 5 pm when she rests for the day. Symptoms last for about one hour consistently, then may return while lying in bed watching TV. Symptoms do not wake her from sleep. She doesn't notice symptoms during her day.   Received Covid-19 booster vaccine (3rd dose-Moderna) on 10/01/20, thought her symptoms were secondary to her Covid-19 booster as symptoms began 2-3 weeks after.   She's tried cutting back on caffeine, down from four cups of coffee to two cups now. She does not consume tea or soda regularly. She admits to drinking little water over the last month. She is drinking 2 glasses of wine some nights during the week, sometimes no wine. She does not check her HR when she notices her symptoms.   She denies chest pain, nausea, diaphoresis, increased anxiety during symptoms. Two weeks ago she was getting out of the car walking into the grocery store, did not have on sunglasses, noticed bilateral blurred vision to left eye, looked at her phone screen and saw wavy, rainbow colored, jagged lines. Vision improved within a few minutes and have not returned.   BP Readings from Last 3 Encounters:  11/24/20 124/82  05/20/20 122/84  05/27/19 140/72     Review of Systems  Constitutional: Negative for diaphoresis.  Respiratory: Negative for shortness of breath.   Cardiovascular: Positive for palpitations. Negative for chest pain.  Gastrointestinal: Negative for nausea.  Neurological: Negative for dizziness.         Past Medical History:  Diagnosis Date  . Benign fundic gland polyps of stomach   . Breast mass   . Clotting  disorder (HCC)    left leg  . Depression   . Endocervical polyp   . Endometrial polyp   . Endometriosis   . Fibroid   . Gastric polyp    Dr. Tiffany Kocher  . GERD (gastroesophageal reflux disease)   . Menorrhagia   . Tongue lesion 11/06/2015    Social History   Socioeconomic History  . Marital status: Married    Spouse name: Not on file  . Number of children: Not on file  . Years of education: Not on file  . Highest education level: Not on file  Occupational History  . Not on file  Tobacco Use  . Smoking status: Never Smoker  . Smokeless tobacco: Never Used  Vaping Use  . Vaping Use: Never used  Substance and Sexual Activity  . Alcohol use: Yes    Comment: occassionally  . Drug use: No  . Sexual activity: Yes    Birth control/protection: Surgical  Other Topics Concern  . Not on file  Social History Narrative   Lives in [place] with husband.      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 DILATION AND CURETTAGE OF UTERUS    . HYSTEROSCOPY    . 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 Carlis Abbott  . TUBAL LIGATION    . VAGINAL HYSTERECTOMY  2001   [facility]  Left Salpingectomy    Family History  Problem Relation Age of Onset  . Diabetes Mother   . Diabetes Father   . Hypertension Father   . Heart disease Father        heart attacks   . Heart attack Father   . Diabetes Maternal Grandfather     Allergies  Allergen Reactions  . Macrodantin [Nitrofurantoin Macrocrystal] Itching and Rash    Current Outpatient Medications on File Prior to Visit  Medication Sig Dispense Refill  .  fexofenadine (ALLEGRA) 180 MG tablet Take 180 mg by mouth as needed for allergies or rhinitis.     No current facility-administered medications on file prior to visit.    BP 124/82   Pulse 76   Temp 97.8 F (36.6 C) (Temporal)   Ht 5\' 5"  (1.651 m)   SpO2 98%   BMI 29.97 kg/m  Objective:   Physical Exam Cardiovascular:     Rate and Rhythm: Normal rate and regular rhythm.  Pulmonary:     Effort: Pulmonary effort is normal.     Breath sounds: Normal breath sounds.  Musculoskeletal:     Cervical back: Neck supple.  Skin:    General: Skin is warm and dry.  Psychiatric:        Mood and Affect: Mood normal.           Assessment & Plan:      This visit occurred during the SARS-CoV-2 public health emergency.  Safety protocols were in place, including screening questions prior to the visit, additional usage of staff PPE, and extensive cleaning of exam room while observing appropriate contact time as indicated for disinfecting solutions.

## 2020-11-24 NOTE — Assessment & Plan Note (Addendum)
Acute for the last two months, occurring at night only. No other symptoms.  Neuro exam today negative.  ECG today with NSR with rate of 65. No acute ST changes, PAC/PVC. Appears similar to prior ECG from September 2021.  Checking labs including TSH, CBC, BMP.  Referral placed to cardiology to rule out paroxysmal atrial fibrillation/other abnormality.

## 2020-12-17 DIAGNOSIS — Z13820 Encounter for screening for osteoporosis: Secondary | ICD-10-CM | POA: Diagnosis not present

## 2020-12-17 DIAGNOSIS — M8588 Other specified disorders of bone density and structure, other site: Secondary | ICD-10-CM | POA: Diagnosis not present

## 2020-12-17 DIAGNOSIS — M8589 Other specified disorders of bone density and structure, multiple sites: Secondary | ICD-10-CM | POA: Diagnosis not present

## 2020-12-17 DIAGNOSIS — Z1231 Encounter for screening mammogram for malignant neoplasm of breast: Secondary | ICD-10-CM | POA: Diagnosis not present

## 2020-12-17 DIAGNOSIS — M85852 Other specified disorders of bone density and structure, left thigh: Secondary | ICD-10-CM | POA: Diagnosis not present

## 2020-12-17 DIAGNOSIS — M85851 Other specified disorders of bone density and structure, right thigh: Secondary | ICD-10-CM | POA: Diagnosis not present

## 2020-12-17 LAB — HM DEXA SCAN

## 2020-12-17 LAB — HM MAMMOGRAPHY

## 2020-12-29 ENCOUNTER — Encounter: Payer: Self-pay | Admitting: Primary Care

## 2020-12-31 ENCOUNTER — Encounter: Payer: Self-pay | Admitting: Primary Care

## 2021-01-04 ENCOUNTER — Encounter: Payer: Self-pay | Admitting: Primary Care

## 2021-01-05 ENCOUNTER — Ambulatory Visit: Payer: Self-pay | Admitting: Cardiology

## 2021-04-27 ENCOUNTER — Telehealth: Payer: Self-pay | Admitting: *Deleted

## 2021-04-27 NOTE — Telephone Encounter (Signed)
Honey  an Access Nurse left a voicemail stating that patient and her husband both tested positive for covid. Honey stated her husband's name is Merry Proud but she does not have his birth date. Honey stated that she is just leaving a message about this and for the office to contact the patient. Tried to call the patient and got her voicemail.  Patient is scheduled for a virtual visit with Dr. Danise Mina in the morning 04/28/21 at 8:00 am.

## 2021-04-28 ENCOUNTER — Telehealth: Payer: Self-pay | Admitting: *Deleted

## 2021-04-28 ENCOUNTER — Telehealth (INDEPENDENT_AMBULATORY_CARE_PROVIDER_SITE_OTHER): Payer: Medicare Other | Admitting: Family Medicine

## 2021-04-28 ENCOUNTER — Telehealth: Payer: Self-pay

## 2021-04-28 ENCOUNTER — Encounter: Payer: Self-pay | Admitting: Family Medicine

## 2021-04-28 VITALS — Temp 102.0°F | Ht 65.0 in | Wt 190.0 lb

## 2021-04-28 DIAGNOSIS — E669 Obesity, unspecified: Secondary | ICD-10-CM | POA: Diagnosis not present

## 2021-04-28 DIAGNOSIS — U071 COVID-19: Secondary | ICD-10-CM

## 2021-04-28 HISTORY — DX: COVID-19: U07.1

## 2021-04-28 NOTE — Telephone Encounter (Signed)
Finally got pt on 3rd attempt and was able to get her ready.

## 2021-04-28 NOTE — Assessment & Plan Note (Addendum)
Reviewed currently approved EUA treatments, specifically Paxlovid. Given significant improvement noted overnight, she opts to monitor symptoms for 24-48 hours and if worsening she will let us know for paxlovid Rx. She would be able to get full dose Paxlovid (GFR 90s) without concerning drug interactions.  Reviewed expected course of illness, anticipated course of recovery, as well as red flags to suggested COVID pneumonia or to seek urgent in-person care. Reviewed latest CDC isolation/quarantining guidelines.  Encouraged fluids and rest. Reviewed further supportive care measures at home including vit C '500mg'$  bid, vit D 2000 IU daily, zinc '100mg'$  daily, tylenol PRN.

## 2021-04-28 NOTE — Telephone Encounter (Signed)
PLEASE NOTE: All timestamps contained within this report are represented as [place] Standard Time. CONFIDENTIALTY NOTICE: This fax transmission is intended only for the addressee. It contains information that is legally privileged, confidential or otherwise protected from use or disclosure. If you are not the intended recipient, you are strictly prohibited from reviewing, disclosing, copying using or disseminating any of this information or taking any action in reliance on or regarding this information. If you have received this fax in error, please notify us immediately by telephone so that we can arrange for its return to [place]. Phone: [phone], Toll-Free: [phone], Fax: [phone] Page: 1 of 3 Call Id: [id number] [facility] - Client TELEPHONE ADVICE RECORD [facility] Patient Name: Lauren Callahan ES Gender: Female DOB: August 21, 1956 Age: 65 Y 10 M 28 D Return Phone Number: [phone] (Primary) Address: City/ State/ Zip: [place]  [address] Client [facility] - Client Client Site [facility] - Day Physician Alma Friendly - NP Contact Type Call Who Is Calling Patient / Member / Family / Caregiver Call Type Triage / Clinical Caller Name Hailie Sinsel Relationship To Patient Spouse Return Phone Number [phone] (Primary) Chief Complaint BREATHING - shortness of breath or sounds breathless Reason for Call Symptomatic / Request for [place] states he tested positive for Covid and his wife woke up very sick. Caller states they think it might be Covid as well. Caller states she is having shortness of breath, wheezing, coughing and congested. Translation No Nurse Assessment Nurse: Matthew Folks, RN, Hannelore Date/Time (Eastern Time): 04/27/2021 4:01:57 PM Confirm and document reason for call. If symptomatic, describe symptoms. ---Caller states she is coughing.  This all started yesterday around 3a. She is a bit achy. She felt lightheaded when she went to the bathroom. T104 oral after she took the down comfort off. Muscle aches, headache, slight running nose, a little sore throat and her chest feels tight. They have 2 vaccines and 1 booster. Does the patient have any new or worsening symptoms? ---Yes Will a triage be completed? ---Yes Related visit to physician within the last 2 weeks? ---No Does the PT have any chronic conditions? (i.e. diabetes, asthma, this includes High risk factors for pregnancy, etc.) ---Yes List chronic conditions. ---occasional acid reflux Is this a behavioral health or substance abuse call? ---No PLEASE NOTE: All timestamps contained within this report are represented as [place] Standard Time. CONFIDENTIALTY NOTICE: This fax transmission is intended only for the addressee. It contains information that is legally privileged, confidential or otherwise protected from use or disclosure. If you are not the intended recipient, you are strictly prohibited from reviewing, disclosing, copying using or disseminating any of this information or taking any action in reliance on or regarding this information. If you have received this fax in error, please notify us immediately by telephone so that we can arrange for its return to [place]. Phone: [phone], Toll-Free: [phone], Fax: [phone] Page: 2 of 3 Call Id: [id number] Guidelines Guideline Title Affirmed Question Affirmed Notes Nurse Date/Time Eilene Ghazi Time) COVID-19 - Diagnosed or Suspected [1] HIGH RISK for severe COVID complications (e.g., weak immune system, age > 34 years, obesity with BMI > 25, pregnant, chronic lung disease or other chronic medical condition) AND [2] COVID symptoms (e.g., cough, fever) (Exceptions: Already seen by PCP and no new or worsening symptoms.) Shook-Minyard, RN, Hannelore 04/27/2021 4:07:41 PM Disp. Time Eilene Ghazi Time)  Disposition Final User 04/27/2021 3:58:14 PM Send to Urgent Mauri Pole,  Astrid 04/27/2021 4:18:00 PM Call PCP Now Yes Shook-Minyard, RN, Ilsa Iha Caller Disagree/Comply Comply Caller Understands Yes PreDisposition Call Doctor Care Advice Given Per Guideline CALL PCP NOW: * Fever: For fever over 101 F (38.3 C), take acetaminophen every 4 to 6 hours (Adults 650 mg) OR ibuprofen every 6 to 8 hours (Adults 400 mg). Before taking any medicine, read all the instructions on the package. Do not take aspirin unless your doctor has prescribed it for you. * Muscle aches, headache, and other pains: Often this comes and goes with the fever. Take acetaminophen every 4 to 6 hours (Adults 650 mg) OR ibuprofen every 6 to 8 hours (Adults 400 mg). Before taking any medicine, read all the instructions on the package. * HOME REMEDY - HONEY: This old home remedy has been shown to help decrease coughing at night. The adult dosage is 2 teaspoons (10 ml) at bedtime. * STAY HOME A MINIMUM OF 5 DAYS: Home isolation is needed for at least 5 days after the symptoms started. Stay home from school or work if you are sick. Do NOT go to religious services, child care centers, shopping, or other public places. Do NOT use public transportation (e.g., bus, taxis, ride-sharing). Do NOT allow any visitors to your home. Leave the house only if you need to seek urgent medical care. CARE ADVICE given per COVID-19 - DIAGNOSED OR SUSPECTED (Adult) guideline. * You become worse CALL BACK IF: After Care Instructions Given Call Event Type User Date / Time Description Education document email Shook-Minyard, RN, Ilsa Iha 04/27/2021 4:19:03 PM COVID-19 Diagnosed or Suspected PLEASE NOTE: All timestamps contained within this report are represented as [place] Standard Time. CONFIDENTIALTY NOTICE: This fax transmission is intended only for the addressee. It contains information that is legally privileged, confidential or otherwise  protected from use or disclosure. If you are not the intended recipient, you are strictly prohibited from reviewing, disclosing, copying using or disseminating any of this information or taking any action in reliance on or regarding this information. If you have received this fax in error, please notify us immediately by telephone so that we can arrange for its return to [place]. Phone: [phone], Toll-Free: [phone], Fax: [phone] Page: 3 of 3 Call Id: [id number] Comments User: Janece Canterbury, RN Date/Time Eilene Ghazi Time): 04/27/2021 4:08:37 PM she has a slight pressure and tightness especially when she coughs. User: Janece Canterbury, RN Date/Time Eilene Ghazi Time): 04/27/2021 4:09:13 PM She took a motrin 30 min ago. User: Janece Canterbury, RN Date/Time Eilene Ghazi Time): 04/27/2021 4:12:21 PM T 102 oral now. User: Janece Canterbury, RN Date/Time Eilene Ghazi Time): 04/27/2021 4:26:22 PM I called the backline multiple times. Once got picked up and hung up on. Called the main line and finally chose talk to the nurse only to get the machine. I left message notifiying them pt and husband have covid sx and husband has a positive test, wife hasn't taken hers yet and their phone number.

## 2021-04-28 NOTE — Progress Notes (Signed)
Patient ID: Lauren Callahan, female    DOB: August 25, 1956, 65 y.o.   MRN: [id number]  Virtual visit completed through [employer], a video enabled telemedicine application. Due to national recommendations of social distancing due to COVID-19, a virtual visit is felt to be most appropriate for this patient at this time. Reviewed limitations, risks, security and privacy concerns of performing a virtual visit and the availability of in person appointments. I also reviewed that there may be a patient responsible charge related to this service. The patient agreed to proceed.   Patient location: home Provider location: [place] at [facility], office Persons participating in this virtual visit: patient, provider   If any vitals were documented, they were collected by patient at home unless specified below.    Temp (!) 102 F (38.9 C)   Ht '5\' 5"'$  (1.651 m)   Wt 190 lb (86.2 kg)   BMI 31.62 kg/m    CC: COVID positive Subjective:   HPI: Lauren Callahan is a 65 y.o. female presenting on 04/28/2021 for Cough (C/o cough, fever- max 102, body aches, congestion, SOB and wheezing.  Sxs started 04/27/21.  Had 3 doses of Moderna, latest 10/01/20. )   First day of symptoms: 04/27/2021 Tested COVID positive: not yet - pending rest today.   Husband tested COVID positive yesterday Current symptoms: fever Tmax 104, congestion, night sweats, wheezing, malaise, fatigue, body aches, mild nausea yesterday, ST and headache. Chest tightness yesterday - today feeling better. Dry cough.  No: dyspnea, loss of taste or smell, abd pain, nausea, diarrhea.  No h/o asthma, COPD, smoking.  Treatments to date: cough syrup, ibuprofen, tylenol Risk factors include: age, obesity   COVID vaccination status: moderna vaccine x2, booster x1       Relevant past medical, surgical, family and social history reviewed and updated as indicated. Interim medical history since our last visit reviewed. Allergies and medications reviewed and  updated. Outpatient Medications Prior to Visit  Medication Sig Dispense Refill   fexofenadine (ALLEGRA) 180 MG tablet Take 180 mg by mouth as needed for allergies or rhinitis.     No facility-administered medications prior to visit.     Per HPI unless specifically indicated in ROS section below Review of Systems Objective:  Temp (!) 102 F (38.9 C)   Ht '5\' 5"'$  (1.651 m)   Wt 190 lb (86.2 kg)   BMI 31.62 kg/m   Wt Readings from Last 3 Encounters:  04/28/21 190 lb (86.2 kg)  05/20/20 180 lb 1.6 oz (81.7 kg)  05/27/19 190 lb 12 oz (86.5 kg)       Physical exam: Gen: alert, NAD, not ill appearing Pulm: speaks in complete sentences without increased work of breathing, intermittent cough present Psych: normal mood, normal thought content      Lab Results  Component Value Date   CREATININE 0.66 11/24/2020   BUN 12 11/24/2020   NA 142 11/24/2020   K 4.7 11/24/2020   CL 108 11/24/2020   CO2 29 11/24/2020    Assessment & Plan:   Problem List Items Addressed This Visit     Obesity (BMI 30-39.9)   COVID-19 virus infection - Primary    Reviewed currently approved EUA treatments, specifically Paxlovid. Given significant improvement noted overnight, she opts to monitor symptoms for 24-48 hours and if worsening she will let us know for paxlovid Rx. She would be able to get full dose Paxlovid (GFR 90s) without concerning drug interactions.  Reviewed expected course of illness, anticipated course  of recovery, as well as red flags to suggested COVID pneumonia or to seek urgent in-person care. Reviewed latest CDC isolation/quarantining guidelines.  Encouraged fluids and rest. Reviewed further supportive care measures at home including vit C '500mg'$  bid, vit D 2000 IU daily, zinc '100mg'$  daily, tylenol PRN.         No orders of the defined types were placed in this encounter.  No orders of the defined types were placed in this encounter.   I discussed the assessment and treatment plan  with the patient. The patient was provided an opportunity to ask questions and all were answered. The patient agreed with the plan and demonstrated an understanding of the instructions. The patient was advised to call back or seek an in-person evaluation if the symptoms worsen or if the condition fails to improve as anticipated.  Follow up plan: No follow-ups on file.  Ria Bush, MD

## 2021-04-28 NOTE — Telephone Encounter (Signed)
Lvm x2 asking pt to call back.  Need to get her ready for MyChart video visit with Dr. Darnell Level at 8:00 today.

## 2021-04-28 NOTE — Telephone Encounter (Signed)
Patient scheduled for a virtual visit this morning with Dr. Danise Mina. See other phone note 04/27/21

## 2021-05-07 NOTE — Telephone Encounter (Signed)
Patient was seen by Dr. Darnell Level do you want me to set up a virtual with her? Or let her know [place] [facility] [place]?

## 2021-07-21 DIAGNOSIS — H2513 Age-related nuclear cataract, bilateral: Secondary | ICD-10-CM | POA: Diagnosis not present

## 2021-10-29 DIAGNOSIS — D485 Neoplasm of uncertain behavior of skin: Secondary | ICD-10-CM | POA: Diagnosis not present

## 2021-10-29 DIAGNOSIS — L821 Other seborrheic keratosis: Secondary | ICD-10-CM | POA: Diagnosis not present

## 2021-10-29 DIAGNOSIS — D235 Other benign neoplasm of skin of trunk: Secondary | ICD-10-CM | POA: Diagnosis not present

## 2021-10-29 DIAGNOSIS — L814 Other melanin hyperpigmentation: Secondary | ICD-10-CM | POA: Diagnosis not present

## 2021-11-30 NOTE — Telephone Encounter (Signed)
Patient already made virtual visit with [facility] for 12/01/21 ?

## 2021-12-01 ENCOUNTER — Telehealth (INDEPENDENT_AMBULATORY_CARE_PROVIDER_SITE_OTHER): Payer: Medicare Other | Admitting: Nurse Practitioner

## 2021-12-01 ENCOUNTER — Other Ambulatory Visit: Payer: Self-pay

## 2021-12-01 ENCOUNTER — Encounter: Payer: Self-pay | Admitting: Nurse Practitioner

## 2021-12-01 DIAGNOSIS — R051 Acute cough: Secondary | ICD-10-CM | POA: Insufficient documentation

## 2021-12-01 DIAGNOSIS — J069 Acute upper respiratory infection, unspecified: Secondary | ICD-10-CM | POA: Diagnosis not present

## 2021-12-01 MED ORDER — AMOXICILLIN 500 MG PO CAPS: 500.0000 mg | ORAL_CAPSULE | Freq: Three times a day (TID) | ORAL | 0 refills | Status: AC

## 2021-12-01 MED ORDER — GUAIFENESIN-CODEINE 100-10 MG/5ML PO SOLN
5.0000 mL | Freq: Three times a day (TID) | ORAL | 0 refills | Status: AC | PRN
Start: 2021-12-01 — End: 2021-12-08

## 2021-12-01 NOTE — Assessment & Plan Note (Signed)
Experiencing a cough at night sometimes of which is keeping her up.  We will write her for some codeine-guaifenesin cough syrup 3 times daily as needed.  Did go over sedation precautions with patient ?

## 2021-12-01 NOTE — Progress Notes (Signed)
? ? Patient ID: Lauren Callahan, female    DOB: 1955-12-12, 66 y.o.   MRN: [id number] ? ?Virtual visit completed through [employer], a video enabled telemedicine application. Due to national recommendations of social distancing due to COVID-19, a virtual visit is felt to be most appropriate for this patient at this time. Reviewed limitations, risks, security and privacy concerns of performing a virtual visit and the availability of in person appointments. I also reviewed that there may be a patient responsible charge related to this service. The patient agreed to proceed.  ? ?Patient location: home ?Provider location: [place] at [facility], office ?Persons participating in this virtual visit: patient, provider  ? ?If any vitals were documented, they were collected by patient at home unless specified below.   ? ?There were no vitals taken for this visit.  ? ?CC: Cough ?Subjective:  ? ?HPI: ?Lauren Callahan is a 66 y.o. female presenting on 12/01/2021 for Cough (Sx started on 11/24/21- coughing up green/yellowish phlegm, originally started with a sore throat and headache-that got better but progressed into cough, congestion. Has taking Robitussin, allergy medication. Gets into coughing spells. Some head congestion. No fever. Covid test negative on 11/30/21.) ? ? ?Symptoms started aporox 1 week ago on  ?Covid test was negative yesterday ?Covid vaccines x2 and one booster ?No sick contact.  ?Robitussin and allergy pills (have helped) ? ? ?Relevant past medical, surgical, family and social history reviewed and updated as indicated. Interim medical history since our last visit reviewed. ?Allergies and medications reviewed and updated. ?Outpatient Medications Prior to Visit  ?Medication Sig Dispense Refill  ? fexofenadine (ALLEGRA) 180 MG tablet Take 180 mg by mouth as needed for allergies or rhinitis.    ? ?No facility-administered medications prior to visit.  ?  ? ?Per HPI unless specifically indicated in ROS section  below ?Review of Systems  ?Constitutional:  Positive for chills and fatigue. Negative for appetite change and fever.  ?HENT:  Positive for congestion and sinus pressure. Negative for ear discharge, ear pain, sinus pain and sore throat.   ?Respiratory:  Positive for cough (colored mucous) and chest tightness. Negative for shortness of breath.   ?Cardiovascular:  Positive for chest pain (from coughing).  ?Gastrointestinal:  Negative for abdominal pain, diarrhea, nausea and vomiting.  ?Musculoskeletal:  Negative for arthralgias and myalgias.  ?Neurological:  Positive for headaches.  ?Objective:  ?There were no vitals taken for this visit.  ?Wt Readings from Last 3 Encounters:  ?04/28/21 190 lb (86.2 kg)  ?05/20/20 180 lb 1.6 oz (81.7 kg)  ?05/27/19 190 lb 12 oz (86.5 kg)  ?  ?  ? ?Physical exam: ?Gen: alert, NAD, not ill appearing ?Pulm: speaks in complete sentences without increased work of breathing ?Psych: normal mood, normal thought content  ? ?   ?Results for orders placed or performed in visit on 12/31/20  ?HM MAMMOGRAPHY  ?Result Value Ref Range  ? HM Mammogram 0-4 Bi-Rad 0-4 Bi-Rad, Self Reported Normal  ? ?Assessment & Plan:  ? ?Problem List Items Addressed This Visit   ? ?  ? Respiratory  ? Upper respiratory tract infection - Primary  ?  Given length of symptoms and patient presentation we will go ahead and elect to treat with amoxicillin 500 mg 3 times daily.  Patient is having some sinus headache has tenderness in her sinuses.  We will treat for sinusitis with amoxicillin also cover lower respiratory infection.  Return to clinic symptoms reviewed and when to seek urgent or emergent health  care reviewed. ?  ?  ? Relevant Medications  ? amoxicillin (AMOXIL) 500 MG capsule  ?  ? Other  ? Acute cough  ?  Experiencing a cough at night sometimes of which is keeping her up.  We will write her for some codeine-guaifenesin cough syrup 3 times daily as needed.  Did go over sedation precautions with patient ?  ?  ?  Relevant Medications  ? guaiFENesin-codeine 100-10 MG/5ML syrup  ?  ? ?Meds ordered this encounter  ?Medications  ? amoxicillin (AMOXIL) 500 MG capsule  ?  Sig: Take 1 capsule (500 mg total) by mouth 3 (three) times daily for 7 days.  ?  Dispense:  21 capsule  ?  Refill:  0  ?  Order Specific Question:   Supervising Provider  ?  Answer:   Loura Pardon A [[id number]]  ? guaiFENesin-codeine 100-10 MG/5ML syrup  ?  Sig: Take 5 mLs by mouth 3 (three) times daily as needed for up to 7 days for cough.  ?  Dispense:  105 mL  ?  Refill:  0  ?  Order Specific Question:   Supervising Provider  ?  Answer:   Loura Pardon A [[id number]]  ? ?No orders of the defined types were placed in this encounter. ? ? ?I discussed the assessment and treatment plan with the patient. The patient was provided an opportunity to ask questions and all were answered. The patient agreed with the plan and demonstrated an understanding of the instructions. The patient was advised to call back or seek an in-person evaluation if the symptoms worsen or if the condition fails to improve as anticipated. ? ?Follow up plan: ?No follow-ups on file. ? ?Romilda Garret, NP   ?

## 2021-12-01 NOTE — Assessment & Plan Note (Signed)
Given length of symptoms and patient presentation we will go ahead and elect to treat with amoxicillin 500 mg 3 times daily.  Patient is having some sinus headache has tenderness in her sinuses.  We will treat for sinusitis with amoxicillin also cover lower respiratory infection.  Return to clinic symptoms reviewed and when to seek urgent or emergent health care reviewed. ?

## 2022-01-04 DIAGNOSIS — Z1231 Encounter for screening mammogram for malignant neoplasm of breast: Secondary | ICD-10-CM | POA: Diagnosis not present

## 2022-01-04 LAB — HM MAMMOGRAPHY

## 2022-03-11 ENCOUNTER — Encounter: Payer: Self-pay | Admitting: Primary Care

## 2022-04-11 DIAGNOSIS — R030 Elevated blood-pressure reading, without diagnosis of hypertension: Secondary | ICD-10-CM | POA: Diagnosis not present

## 2022-04-11 DIAGNOSIS — Z8349 Family history of other endocrine, nutritional and metabolic diseases: Secondary | ICD-10-CM | POA: Diagnosis not present

## 2022-04-11 DIAGNOSIS — E119 Type 2 diabetes mellitus without complications: Secondary | ICD-10-CM | POA: Diagnosis not present

## 2022-04-11 DIAGNOSIS — Z833 Family history of diabetes mellitus: Secondary | ICD-10-CM | POA: Diagnosis not present

## 2022-07-08 ENCOUNTER — Telehealth: Payer: Self-pay | Admitting: Primary Care

## 2022-07-08 NOTE — Telephone Encounter (Signed)
Left message for patient to schedule Annual Wellness Visit.  Please schedule with Nurse Health Advisor Denisa O'Brien-Blaney, LPN  This appt can be telephone. Please call [phone] ask for [facility]

## 2022-09-26 ENCOUNTER — Telehealth: Payer: Self-pay | Admitting: Primary Care

## 2022-09-26 NOTE — Telephone Encounter (Signed)
LVM for pt to rtn my call to schedule AWV with [facility] call back # [phone] 

## 2022-09-26 NOTE — Telephone Encounter (Signed)
Please kindly notify patient that I cannot add those labs without her visit and further discussion. Happy to add those after discussion during her appointment. I recommend she cancel her lab appt and just come in for her CPE.

## 2022-09-26 NOTE — Telephone Encounter (Signed)
Patient called in and stated that she is coming in for lab work on January 25th and would like for an order to be put in for TSH, T3, T4, and reverse T3. Thank you!

## 2022-09-27 NOTE — Telephone Encounter (Signed)
Notified patient, she will cancel lab appointment and discuss at CPE appt.

## 2022-10-04 ENCOUNTER — Ambulatory Visit (INDEPENDENT_AMBULATORY_CARE_PROVIDER_SITE_OTHER): Payer: Medicare Other

## 2022-10-04 VITALS — Wt 190.0 lb

## 2022-10-04 DIAGNOSIS — Z0000 Encounter for general adult medical examination without abnormal findings: Secondary | ICD-10-CM | POA: Diagnosis not present

## 2022-10-04 NOTE — Patient Instructions (Signed)
Lauren Callahan , Thank you for taking time to come for your Medicare Wellness Visit. I appreciate your ongoing commitment to your health goals. Please review the following plan we discussed and let me know if I can assist you in the future.   These are the goals we discussed:  Goals   None     This is a list of the screening recommended for you and due dates:  Health Maintenance  Topic Date Due   Zoster (Shingles) Vaccine (1 of 2) Never done   Pneumonia Vaccine (2 - PPSV23 or PCV20) 07/15/2020   Flu Shot  04/12/2022   COVID-19 Vaccine (4 - 2023-24 season) 05/13/2022   DEXA scan (bone density measurement)  12/18/2022   Colon Cancer Screening  09/18/2023   Medicare Annual Wellness Visit  10/05/2023   Mammogram  01/05/2024   DTaP/Tdap/Td vaccine (2 - Td or Tdap) 10/22/2024   Hepatitis C Screening: USPSTF Recommendation to screen - Ages 80-79 yo.  Completed   HPV Vaccine  Aged Out    Advanced directives: Please bring a copy of your health care power of attorney and living will to the office at your convenience.  Conditions/risks identified: get healthier and lose weight  Next appointment: Follow up in one year for your annual wellness visit    Preventive Care 65 Years and Older, Female Preventive care refers to lifestyle choices and visits with your health care provider that can promote health and wellness. What does preventive care include? A yearly physical exam. This is also called an annual well check. Dental exams once or twice a year. Routine eye exams. Ask your health care provider how often you should have your eyes checked. Personal lifestyle choices, including: Daily care of your teeth and gums. Regular physical activity. Eating a healthy diet. Avoiding tobacco and drug use. Limiting alcohol use. Practicing safe sex. Taking low-dose aspirin every day. Taking vitamin and mineral supplements as recommended by your health care provider. What happens during an annual well  check? The services and screenings done by your health care provider during your annual well check will depend on your age, overall health, lifestyle risk factors, and family history of disease. Counseling  Your health care provider may ask you questions about your: Alcohol use. Tobacco use. Drug use. Emotional well-being. Home and relationship well-being. Sexual activity. Eating habits. History of falls. Memory and ability to understand (cognition). Work and work Statistician. Reproductive health. Screening  You may have the following tests or measurements: Height, weight, and BMI. Blood pressure. Lipid and cholesterol levels. These may be checked every 5 years, or more frequently if you are over 42 years old. Skin check. Lung cancer screening. You may have this screening every year starting at age 6 if you have a 30-pack-year history of smoking and currently smoke or have quit within the past 15 years. Fecal occult blood test (FOBT) of the stool. You may have this test every year starting at age 73. Flexible sigmoidoscopy or colonoscopy. You may have a sigmoidoscopy every 5 years or a colonoscopy every 10 years starting at age 43. Hepatitis C blood test. Hepatitis B blood test. Sexually transmitted disease (STD) testing. Diabetes screening. This is done by checking your blood sugar (glucose) after you have not eaten for a while (fasting). You may have this done every 1-3 years. Bone density scan. This is done to screen for osteoporosis. You may have this done starting at age 52. Mammogram. This may be done every 1-2 years. Talk  to your health care provider about how often you should have regular mammograms. Talk with your health care provider about your test results, treatment options, and if necessary, the need for more tests. Vaccines  Your health care provider may recommend certain vaccines, such as: Influenza vaccine. This is recommended every year. Tetanus, diphtheria, and  acellular pertussis (Tdap, Td) vaccine. You may need a Td booster every 10 years. Zoster vaccine. You may need this after age 63. Pneumococcal 13-valent conjugate (PCV13) vaccine. One dose is recommended after age 43. Pneumococcal polysaccharide (PPSV23) vaccine. One dose is recommended after age 63. Talk to your health care provider about which screenings and vaccines you need and how often you need them. This information is not intended to replace advice given to you by your health care provider. Make sure you discuss any questions you have with your health care provider. Document Released: 09/25/2015 Document Revised: 05/18/2016 Document Reviewed: 06/30/2015 Elsevier Interactive Patient Education  2017 [place] Prevention in the Home Falls can cause injuries. They can happen to people of all ages. There are many things you can do to make your home safe and to help prevent falls. What can I do on the outside of my home? Regularly fix the edges of walkways and driveways and fix any cracks. Remove anything that might make you trip as you walk through a door, such as a raised step or threshold. Trim any bushes or trees on the path to your home. Use bright outdoor lighting. Clear any walking paths of anything that might make someone trip, such as rocks or tools. Regularly check to see if handrails are loose or broken. Make sure that both sides of any steps have handrails. Any raised decks and porches should have guardrails on the edges. Have any leaves, snow, or ice cleared regularly. Use sand or salt on walking paths during winter. Clean up any spills in your garage right away. This includes oil or grease spills. What can I do in the bathroom? Use night lights. Install grab bars by the toilet and in the tub and shower. Do not use towel bars as grab bars. Use non-skid mats or decals in the tub or shower. If you need to sit down in the shower, use a plastic, non-slip stool. Keep  the floor dry. Clean up any water that spills on the floor as soon as it happens. Remove soap buildup in the tub or shower regularly. Attach bath mats securely with double-sided non-slip rug tape. Do not have throw rugs and other things on the floor that can make you trip. What can I do in the bedroom? Use night lights. Make sure that you have a light by your bed that is easy to reach. Do not use any sheets or blankets that are too big for your bed. They should not hang down onto the floor. Have a firm chair that has side arms. You can use this for support while you get dressed. Do not have throw rugs and other things on the floor that can make you trip. What can I do in the kitchen? Clean up any spills right away. Avoid walking on wet floors. Keep items that you use a lot in easy-to-reach places. If you need to reach something above you, use a strong step stool that has a grab bar. Keep electrical cords out of the way. Do not use floor polish or wax that makes floors slippery. If you must use wax, use non-skid floor wax. Do  not have throw rugs and other things on the floor that can make you trip. What can I do with my stairs? Do not leave any items on the stairs. Make sure that there are handrails on both sides of the stairs and use them. Fix handrails that are broken or loose. Make sure that handrails are as long as the stairways. Check any carpeting to make sure that it is firmly attached to the stairs. Fix any carpet that is loose or worn. Avoid having throw rugs at the top or bottom of the stairs. If you do have throw rugs, attach them to the floor with carpet tape. Make sure that you have a light switch at the top of the stairs and the bottom of the stairs. If you do not have them, ask someone to add them for you. What else can I do to help prevent falls? Wear shoes that: Do not have high heels. Have rubber bottoms. Are comfortable and fit you well. Are closed at the toe. Do not  wear sandals. If you use a stepladder: Make sure that it is fully opened. Do not climb a closed stepladder. Make sure that both sides of the stepladder are locked into place. Ask someone to hold it for you, if possible. Clearly mark and make sure that you can see: Any grab bars or handrails. First and last steps. Where the edge of each step is. Use tools that help you move around (mobility aids) if they are needed. These include: Canes. Walkers. Scooters. Crutches. Turn on the lights when you go into a dark area. Replace any light bulbs as soon as they burn out. Set up your furniture so you have a clear path. Avoid moving your furniture around. If any of your floors are uneven, fix them. If there are any pets around you, be aware of where they are. Review your medicines with your doctor. Some medicines can make you feel dizzy. This can increase your chance of falling. Ask your doctor what other things that you can do to help prevent falls. This information is not intended to replace advice given to you by your health care provider. Make sure you discuss any questions you have with your health care provider. Document Released: 06/25/2009 Document Revised: 02/04/2016 Document Reviewed: 10/03/2014 Elsevier Interactive Patient Education  2017 [employer].

## 2022-10-04 NOTE — Progress Notes (Signed)
I connected with  Lauren Callahan on 10/04/22 by a audio enabled telemedicine application and verified that I am speaking with the correct person using two identifiers.  Patient Location: Home  Provider Location: Office/Clinic  I discussed the limitations of evaluation and management by telemedicine. The patient expressed understanding and agreed to proceed.   Subjective:   Lauren Callahan is a 67 y.o. female who presents for an Initial Medicare Annual Wellness Visit.  Review of Systems     Cardiac Risk Factors include: advanced age (>86mn, >>67women);obesity (BMI >30kg/m2)     Objective:    Today's Vitals   10/04/22 0904  Weight: 190 lb (86.2 kg)   Body mass index is 31.62 kg/m.     10/04/2022    9:19 AM 06/23/2016    4:59 PM  Advanced Directives  Does Patient Have a Medical Advance Directive? Yes Yes  Type of AParamedicof AHazel DellLiving will   Copy of HArpelarin Chart? No - copy requested     Current Medications (verified) Outpatient Encounter Medications as of 10/04/2022  Medication Sig   fexofenadine (ALLEGRA) 180 MG tablet Take 180 mg by mouth as needed for allergies or rhinitis.   No facility-administered encounter medications on file as of 10/04/2022.    Allergies (verified) Macrodantin [nitrofurantoin macrocrystal]   History: Past Medical History:  Diagnosis Date   Benign fundic gland polyps of stomach    Breast mass    Clotting disorder (HCC)    left leg   Depression    Endocervical polyp    Endometrial polyp    Endometriosis    Fibroid    Gastric polyp    Dr. 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 CCarlis Abbott  TUBAL LIGATION     VAGINAL HYSTERECTOMY  2001   LAVH  Left Salpingectomy   Family  History  Problem Relation Age of Onset   Diabetes Mother    Diabetes Father    Hypertension Father    Heart disease Father        heart attacks    Heart attack Father    Diabetes Maternal Grandfather    Social History   Socioeconomic History   Marital status: Married    Spouse name: Not on file   Number of children: Not on file   Years of education: Not on file   Highest education level: Not on file  Occupational History   Not on file  Tobacco Use   Smoking status: Never   Smokeless tobacco: Never  Vaping Use   Vaping Use: Never used  Substance and Sexual Activity   Alcohol use: Yes    Comment: occassionally   Drug use: No   Sexual activity: Yes    Birth control/protection: Surgical  Other Topics Concern   Not on file  Social History Narrative   [place]      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      Diet - regular      Exercise - limited   Social Determinants of Health   Financial Resource Strain: LSher[place] (10/04/2022)   Overall Financial Resource Strain (CARDIA)    Difficulty of Paying Living Expenses:  Not hard at all  Food Insecurity: No Food Insecurity (10/04/2022)   Hunger Vital Sign    Worried About Running Out of Food in the Last Year: Never true    Ran Out of Food in the Last Year: Never true  Transportation Needs: No Transportation Needs (10/04/2022)   PRAPARE - Hydrologist (Medical): No    Lack of Transportation (Non-Medical): No  Physical Activity: Inactive (10/04/2022)   Exercise Vital Sign    Days of Exercise per Week: 0 days    Minutes of Exercise per Session: 0 min  Stress: No Stress Concern Present (10/04/2022)   [place]    Feeling of Stress : Not at all  Social Connections: [place] (10/04/2022)   Social Connection and Isolation Panel [NHANES]    Frequency of Communication with Friends and Family:  More than three times a week    Frequency of Social Gatherings with Friends and Family: More than three times a week    Attends Religious Services: 1 to 4 times per year    Active Member of [employer] or Organizations: Yes    Attends Archivist Meetings: 1 to 4 times per year    Marital Status: Married    Tobacco Counseling Counseling given: Not Answered   Clinical Intake:  Pre-visit preparation completed: Yes  Pain : No/denies pain     Nutritional Risks: None Diabetes: No  How often do you need to have someone help you when you read instructions, pamphlets, or other written materials from your doctor or pharmacy?: 1 - Never  Diabetic?no  Interpreter Needed?: No  Information entered by :: Charlott Rakes, LPN   Activities of Daily Living    10/04/2022    9:20 AM  In your present state of health, do you have any difficulty performing the following activities:  Hearing? 0  Vision? 0  Difficulty concentrating or making decisions? 0  Walking or climbing stairs? 0  Dressing or bathing? 0  Doing errands, shopping? 0  Preparing Food and eating ? N  Using the Toilet? N  In the past six months, have you accidently leaked urine? N  Do you have problems with loss of bowel control? N  Managing your Medications? N  Managing your Finances? N  Housekeeping or managing your Housekeeping? N    Patient Care Team: Pleas Koch, NP as PCP - General (Internal Medicine)  Indicate any recent Medical Services you may have received from other than [facility] providers in the past year (date may be approximate).     Assessment:   This is a routine wellness examination for [place].  Hearing/Vision screen Hearing Screening - Comments:: Pt denies any hearing issues  Vision Screening - Comments:: Pt follows up with [facility] for annual eye exams   Dietary issues and exercise activities discussed: Current Exercise Habits: The patient does not participate in regular exercise at  present   Goals Addressed             This Visit's Progress    Patient Stated       Getting healthier and lose weight        Depression Screen    10/04/2022    9:18 AM 05/20/2020   10:12 AM 12/02/2015    8:37 AM 11/06/2015    8:53 AM  PHQ 2/9 Scores  PHQ - 2 Score 0 0 0 0    Fall Risk  10/04/2022    9:20 AM 05/20/2020   10:12 AM 11/06/2015    8:53 AM  Fall Risk   Falls in the past year? 0 0 No  Number falls in past yr: 0 0   Injury with Fall? 0 0   Risk for fall due to : Impaired vision    Follow up Falls prevention discussed      FALL RISK PREVENTION PERTAINING TO THE HOME:  Any stairs in or around the home? Yes  If so, are there any without handrails? No  Home free of loose throw rugs in walkways, pet beds, electrical cords, etc? Yes  Adequate lighting in your home to reduce risk of falls? Yes   ASSISTIVE DEVICES UTILIZED TO PREVENT FALLS:  Life alert? No  Use of a cane, walker or w/c? No  Grab bars in the bathroom? No  Shower chair or bench in shower? No  Elevated toilet seat or a handicapped toilet? No   TIMED UP AND GO:  Was the test performed? No .   Cognitive Function:        10/04/2022    9:21 AM  6CIT Screen  What Year? 0 points  What month? 0 points  What time? 0 points  Count back from 20 0 points  Months in reverse 0 points  Repeat phrase 0 points  Total Score 0 points    Immunizations Immunization History  Administered Date(s) Administered   Fluad Quad(high Dose 65+) 05/20/2020   Influenza,inj,Quad PF,6+ Mos 10/22/2014, 11/06/2015, 05/27/2019   Influenza-Unspecified 10/22/2014, 11/06/2015, 05/27/2019   Moderna Sars-Covid-2 Vaccination 10/25/2019, 11/18/2019, 10/01/2020   Pneumococcal Conjugate-13 05/20/2020   Tdap 10/22/2014    TDAP status: Up to date  Flu Vaccine status: Declined, Education has been provided regarding the importance of this vaccine but patient still declined. Advised may receive this vaccine at local  pharmacy or Health Dept. Aware to provide a copy of the vaccination record if obtained from local pharmacy or Health Dept. Verbalized acceptance and understanding.  Pneumococcal vaccine status: Declined,  Education has been provided regarding the importance of this vaccine but patient still declined. Advised may receive this vaccine at local pharmacy or Health Dept. Aware to provide a copy of the vaccination record if obtained from local pharmacy or Health Dept. Verbalized acceptance and understanding.   Covid-19 vaccine status: Completed vaccines  Qualifies for Shingles Vaccine? Yes   Zostavax completed No   Shingrix Completed?: No.    Education has been provided regarding the importance of this vaccine. Patient has been advised to call insurance company to determine out of pocket expense if they have not yet received this vaccine. Advised may also receive vaccine at local pharmacy or Health Dept. Verbalized acceptance and understanding.  Screening Tests Health Maintenance  Topic Date Due   Zoster Vaccines- Shingrix (1 of 2) Never done   Pneumonia Vaccine 54+ Years old (2 - PPSV23 or PCV20) 07/15/2020   INFLUENZA VACCINE  04/12/2022   COVID-19 Vaccine (4 - 2023-24 season) 05/13/2022   DEXA SCAN  12/18/2022   COLONOSCOPY (Pts 45-75yr Insurance coverage will need to be confirmed)  09/18/2023   Medicare Annual Wellness (AWV)  10/05/2023   MAMMOGRAM  01/05/2024   DTaP/Tdap/Td (2 - Td or Tdap) 10/22/2024   Hepatitis C Screening  Completed   HPV VACCINES  Aged Out    Health Maintenance  Health Maintenance Due  Topic Date Due   Zoster Vaccines- Shingrix (1 of 2) Never done   Pneumonia Vaccine 65+  Years old (2 - PPSV23 or PCV20) 07/15/2020   INFLUENZA VACCINE  04/12/2022   COVID-19 Vaccine (4 - 2023-24 season) 05/13/2022    Colorectal cancer screening: Type of screening: Colonoscopy. Completed 09/17/13. Repeat every 10 years  Mammogram status: Completed 01/04/22. Repeat every  year  Bone Density status: Completed 12/17/20. Results reflect: Bone density results: OSTEOPENIA. Repeat every 2 years.   Additional Screening:  Hepatitis C Screening: Completed 05/27/19  Vision Screening: Recommended annual ophthalmology exams for early detection of glaucoma and other disorders of the eye. Is the patient up to date with their annual eye exam?  Yes  Who is the provider or what is the name of the office in which the patient attends annual eye exams? [facility]  If pt is not established with a provider, would they like to be referred to a provider to establish care? No .   Dental Screening: Recommended annual dental exams for proper oral hygiene  Community Resource Referral / Chronic Care Management: CRR required this visit?  No   CCM required this visit?  No      Plan:     I have personally reviewed and noted the following in the patient's chart:   Medical and social history Use of alcohol, tobacco or illicit drugs  Current medications and supplements including opioid prescriptions. Patient is not currently taking opioid prescriptions. Functional ability and status Nutritional status Physical activity Advanced directives List of other physicians Hospitalizations, surgeries, and ER visits in previous 12 months Vitals Screenings to include cognitive, depression, and falls Referrals and appointments  In addition, I have reviewed and discussed with patient certain preventive protocols, quality metrics, and best practice recommendations. A written personalized care plan for preventive services as well as general preventive health recommendations were provided to patient.     Willette Brace, LPN   [id number]   Nurse Notes: none

## 2022-10-06 ENCOUNTER — Other Ambulatory Visit: Payer: Medicare Other

## 2022-10-12 ENCOUNTER — Ambulatory Visit (INDEPENDENT_AMBULATORY_CARE_PROVIDER_SITE_OTHER)
Admission: RE | Admit: 2022-10-12 | Discharge: 2022-10-12 | Disposition: A | Discharge status: Home / Self Care | Payer: Medicare Other | Source: Ambulatory Visit | Attending: Primary Care | Admitting: Primary Care

## 2022-10-12 ENCOUNTER — Ambulatory Visit (INDEPENDENT_AMBULATORY_CARE_PROVIDER_SITE_OTHER): Payer: Medicare Other | Admitting: Primary Care

## 2022-10-12 ENCOUNTER — Other Ambulatory Visit: Payer: Self-pay | Admitting: Primary Care

## 2022-10-12 ENCOUNTER — Encounter: Payer: Self-pay | Admitting: Primary Care

## 2022-10-12 VITALS — BP 128/76 | HR 65 | Temp 97.8°F | Ht 65.0 in | Wt 195.0 lb

## 2022-10-12 DIAGNOSIS — R229 Localized swelling, mass and lump, unspecified: Secondary | ICD-10-CM | POA: Insufficient documentation

## 2022-10-12 DIAGNOSIS — E875 Hyperkalemia: Secondary | ICD-10-CM

## 2022-10-12 DIAGNOSIS — M25551 Pain in right hip: Secondary | ICD-10-CM | POA: Diagnosis not present

## 2022-10-12 DIAGNOSIS — G8929 Other chronic pain: Secondary | ICD-10-CM | POA: Diagnosis not present

## 2022-10-12 DIAGNOSIS — M19071 Primary osteoarthritis, right ankle and foot: Secondary | ICD-10-CM | POA: Diagnosis not present

## 2022-10-12 DIAGNOSIS — Z0001 Encounter for general adult medical examination with abnormal findings: Secondary | ICD-10-CM | POA: Diagnosis not present

## 2022-10-12 DIAGNOSIS — F4323 Adjustment disorder with mixed anxiety and depressed mood: Secondary | ICD-10-CM

## 2022-10-12 DIAGNOSIS — R7303 Prediabetes: Secondary | ICD-10-CM | POA: Diagnosis not present

## 2022-10-12 DIAGNOSIS — M545 Low back pain, unspecified: Secondary | ICD-10-CM | POA: Insufficient documentation

## 2022-10-12 DIAGNOSIS — E781 Pure hyperglyceridemia: Secondary | ICD-10-CM | POA: Diagnosis not present

## 2022-10-12 DIAGNOSIS — E2839 Other primary ovarian failure: Secondary | ICD-10-CM

## 2022-10-12 DIAGNOSIS — M7731 Calcaneal spur, right foot: Secondary | ICD-10-CM | POA: Diagnosis not present

## 2022-10-12 DIAGNOSIS — Z1231 Encounter for screening mammogram for malignant neoplasm of breast: Secondary | ICD-10-CM | POA: Diagnosis not present

## 2022-10-12 DIAGNOSIS — R2241 Localized swelling, mass and lump, right lower limb: Secondary | ICD-10-CM | POA: Diagnosis not present

## 2022-10-12 LAB — COMPREHENSIVE METABOLIC PANEL
ALT: 21 U/L (ref 0–35)
AST: 19 U/L (ref 0–37)
Albumin: 4.6 g/dL (ref 3.5–5.2)
Alkaline Phosphatase: 66 U/L (ref 39–117)
BUN: 13 mg/dL (ref 6–23)
CO2: 28 mEq/L (ref 19–32)
Calcium: 9.7 mg/dL (ref 8.4–10.5)
Chloride: 108 mEq/L (ref 96–112)
Creatinine, Ser: 0.76 mg/dL (ref 0.40–1.20)
GFR: 80.55 mL/min (ref >60.00)
Glucose, Bld: 97 mg/dL (ref 70–99)
Potassium: 5.6 mEq/L — ABNORMAL HIGH (ref 3.5–5.1)
Sodium: 143 mEq/L (ref 135–145)
Total Bilirubin: 0.6 mg/dL (ref 0.2–1.2)
Total Protein: 6.8 g/dL (ref 6.0–8.3)

## 2022-10-12 LAB — LIPID PANEL
Cholesterol: 164 mg/dL (ref 0–200)
HDL: 49.7 mg/dL (ref >39.00)
LDL Cholesterol: 96 mg/dL (ref 0–99)
NonHDL: 114.47
Total CHOL/HDL Ratio: 3
Triglycerides: 94 mg/dL (ref 0.0–149.0)
VLDL: 18.8 mg/dL (ref 0.0–40.0)

## 2022-10-12 LAB — TSH: TSH: 2.9 u[IU]/mL (ref 0.35–5.50)

## 2022-10-12 LAB — HEMOGLOBIN A1C: Hgb A1c MFr Bld: 5.6 % (ref 4.6–6.5)

## 2022-10-12 MED ORDER — MELOXICAM 7.5 MG PO TABS: 7.5000 mg | ORAL_TABLET | Freq: Every day | ORAL | 0 refills | Status: DC | PRN

## 2022-10-12 NOTE — Assessment & Plan Note (Signed)
No concerns today. Continue to monitor. 

## 2022-10-12 NOTE — Assessment & Plan Note (Signed)
Discussed the importance of a healthy diet and regular exercise in order for weight loss, and to reduce the risk of further co-morbidity.  Repeat lipid panel pending. 

## 2022-10-12 NOTE — Addendum Note (Signed)
Addended by: Pleas Koch on: 10/12/2022 09:11 AM   Modules accepted: Orders

## 2022-10-12 NOTE — Assessment & Plan Note (Signed)
Strongly advise she increase physical activity.  Referral placed to physical therapy. Start meloxicam 7.5 mg as needed.

## 2022-10-12 NOTE — Assessment & Plan Note (Signed)
Declines Shingrix and influenza vaccines.  Mammogram UTD, due April 2024. Bone density scan due this year, due in 2024. Orders placed. Colonoscopy utd, due 2027 per patient.  Discussed the importance of a healthy diet and regular exercise in order for weight loss, and to reduce the risk of further co-morbidity.  Exam stable. Labs pending.  Follow up in 1 year for repeat physical.

## 2022-10-12 NOTE — Assessment & Plan Note (Signed)
Exam today representative of benign cyst. X-ray of the right foot ordered and pending.  She will also follow-up with dermatology next month.

## 2022-10-12 NOTE — Patient Instructions (Addendum)
Stop by the lab and xray prior to leaving today. I will notify you of your results once received.   You can take meloxicam 7.5 mg as needed for hip pain.  Increase physical exercise.   [facility] your mammogram and bone density scan.   You will either be contacted via phone regarding your referral to physical therapy, or you may receive a letter on your MyChart portal from our referral team with instructions for scheduling an appointment. Please let us know if you have not been contacted by anyone within two weeks.  It was a pleasure to see you today!

## 2022-10-12 NOTE — Assessment & Plan Note (Signed)
Reviewed A1C from July 2023 through [facility].  Repeat A1C pending.

## 2022-10-12 NOTE — Progress Notes (Addendum)
Subjective:    Patient ID: Lauren Callahan, female    DOB: 1956-02-27, 67 y.o.   MRN: [id number]  HPI  Lauren Callahan is a very pleasant 67 y.o. female who presents today for complete physical and follow up of chronic conditions.  She would also like to discuss chronic hip pain. Chronic and intermittent to the right hip. Previously evaluated by orthopedics years ago, was prescribed an "inflammatory medication" which helped. Last night she developed a flare of her hip pain with pain to the right hip and radiation to right lateral thigh to the anterior knee.   She would also like to discuss chronic back pain. Chronic midline back pain for which she describes as achy. She will sometimes experience a "catch" to her left upper back with spasm. She leads a sedentary lifestyle, sits a lot during her day. Plans on increasing physical exercise in February. She would like a referral to physical therapy.  She would also like evaluation of a mass to the right foot. Chronic for about 1 year and located to the right medial foot. Over time she's noticed growth. She denies pain, erythema. She does have a history of cysts to other parts of her body.  Immunizations: -Tetanus: Completed in 2016 -Influenza: Declines  -Shingles: Never completed Shingrix series -Pneumonia: Completed Prevnar 13 in 2021.   Diet: Fair diet. Working to improve her diet.  Exercise: No regular exercise.  Eye exam: Completes annually  Dental exam: Completes semi-annually   Mammogram: Completed in April 2023  Colonoscopy: Completed in 2017, due 2027 Dexa: Completed in April 2022   BP Readings from Last 3 Encounters:  10/12/22 128/76  11/24/20 124/82  05/20/20 122/84    Wt Readings from Last 3 Encounters:  10/12/22 195 lb (88.5 kg)  10/04/22 190 lb (86.2 kg)  04/28/21 190 lb (86.2 kg)      Review of Systems  Constitutional:  Negative for unexpected weight change.  HENT:  Negative for rhinorrhea.   Respiratory:   Negative for cough and shortness of breath.   Cardiovascular:  Negative for chest pain.  Gastrointestinal:  Positive for constipation. Negative for diarrhea.  Genitourinary:  Negative for difficulty urinating.  Musculoskeletal:  Positive for arthralgias. Negative for myalgias.  Skin:  Negative for rash.  Allergic/Immunologic: Negative for environmental allergies.  Neurological:  Negative for dizziness and headaches.  Psychiatric/Behavioral:  The patient is not nervous/anxious.          Past Medical History:  Diagnosis Date   Benign fundic gland polyps of stomach    Breast mass    Clotting disorder (HCC)    left leg   COVID-19 virus infection 04/28/2021   Depression    Endocervical polyp    Endometrial polyp    Endometriosis    Fibroid    Gastric polyp    Dr. Tiffany Kocher   GERD (gastroesophageal reflux disease)    History of gastritis 01/17/2019   Menorrhagia    Tongue lesion 11/06/2015    Social History   Socioeconomic History   Marital status: Married    Spouse name: Not on file   Number of children: Not on file   Years of education: Not on file   Highest education level: Not on file  Occupational History   Not on file  Tobacco Use   Smoking status: Never   Smokeless tobacco: Never  Vaping Use   Vaping Use: Never used  Substance and Sexual Activity   Alcohol use: Yes    Comment: occassionally  Drug use: No   Sexual activity: Yes    Birth control/protection: Surgical  Other Topics Concern   Not on file  Social History Narrative   Lives in [place] with husband.      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      Diet - regular      Exercise - limited   Social Determinants of Health   Financial Resource Strain: [place]  (10/04/2022)   Overall Financial Resource Strain (CARDIA)    Difficulty of Paying Living Expenses: Not hard at all  Food Insecurity: No Food Insecurity (10/04/2022)   Hunger Vital Sign    Worried About Running Out of Food in  the Last Year: Never true    [place] in the Last Year: Never true  Transportation Needs: No Transportation Needs (10/04/2022)   PRAPARE - Hydrologist (Medical): No    Lack of Transportation (Non-Medical): No  Physical Activity: Inactive (10/04/2022)   Exercise Vital Sign    Days of Exercise per Week: 0 days    Minutes of Exercise per Session: 0 min  Stress: No Stress Concern Present (10/04/2022)   [place]    Feeling of Stress : Not at all  Social Connections: [place] (10/04/2022)   Social Connection and Isolation Panel [NHANES]    Frequency of Communication with Friends and Family: More than three times a week    Frequency of Social Gatherings with Friends and Family: More than three times a week    Attends Religious Services: 1 to 4 times per year    Active Member of [employer] or Organizations: Yes    Attends Archivist Meetings: 1 to 4 times per year    Marital Status: Married  Human resources officer Violence: Not At Risk (10/04/2022)   Humiliation, Afraid, Rape, and Kick questionnaire    Fear of Current or Ex-Partner: No    Emotionally Abused: No    Physically Abused: No    Sexually Abused: No    Past Surgical History:  Procedure Laterality Date   DILATION AND CURETTAGE OF UTERUS     HYSTEROSCOPY     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 Carlis Abbott   TUBAL LIGATION     VAGINAL HYSTERECTOMY  2001   LAVH  Left Salpingectomy    Family History  Problem Relation Age of Onset   Diabetes Mother    Diabetes Father    Hypertension Father    Heart disease Father        heart attacks    Heart attack Father    Diabetes Maternal Grandfather     Allergies  Allergen Reactions   Macrodantin [Nitrofurantoin Macrocrystal] Itching and Rash    Current Outpatient Medications on File Prior to Visit  Medication Sig Dispense Refill   fexofenadine (ALLEGRA) 180 MG  tablet Take 180 mg by mouth as needed for allergies or rhinitis.     Multiple Vitamin (MULTIVITAMIN ADULT PO) Take by mouth.     Omega-3 Fatty Acids (OMEGA-3 EPA FISH OIL PO) Take by mouth.     No current facility-administered medications on file prior to visit.    BP 128/76   Pulse 65   Temp 97.8 F (36.6 C) (Temporal)   Ht '5\' 5"'$  (1.651 m)   Wt 195 lb (88.5 kg)   SpO2 99%   BMI 32.45 kg/m  Objective:   Physical Exam  HENT:     Right Ear: Tympanic membrane and ear canal normal.     Left Ear: Tympanic membrane and ear canal normal.     Nose: Nose normal.  Eyes:     Conjunctiva/sclera: Conjunctivae normal.     Pupils: Pupils are equal, round, and reactive to light.  Neck:     Thyroid: No thyromegaly.  Cardiovascular:     Rate and Rhythm: Normal rate and regular rhythm.     Heart sounds: No murmur heard. Pulmonary:     Effort: Pulmonary effort is normal.     Breath sounds: Normal breath sounds. No rales.  Abdominal:     General: Bowel sounds are normal.     Palpations: Abdomen is soft.     Tenderness: There is no abdominal tenderness.  Musculoskeletal:        General: Normal range of motion.     Cervical back: Neck supple.     Lumbar back: No bony tenderness. Normal range of motion.       Back:     Right hip: Normal range of motion. Normal strength.  Lymphadenopathy:     Cervical: No cervical adenopathy.  Skin:    General: Skin is warm and dry.     Findings: No rash.     Comments: 1.5 cm rounded, immobile, non tender, soft mass to right medial foot.   Neurological:     Mental Status: She is alert and oriented to person, place, and time.     Cranial Nerves: No cranial nerve deficit.     Deep Tendon Reflexes: Reflexes are normal and symmetric.  Psychiatric:        Mood and Affect: Mood normal.           Assessment & Plan:  Encounter for annual general medical examination with abnormal findings in adult Assessment & Plan: Declines Shingrix and influenza  vaccines.  Mammogram UTD, due April 2024. Bone density scan due this year, due in 2024. Orders placed. Colonoscopy utd, due 2027 per patient.  Discussed the importance of a healthy diet and regular exercise in order for weight loss, and to reduce the risk of further co-morbidity.  Exam stable. Labs pending.  Follow up in 1 year for repeat physical.    Screening mammogram for breast cancer -     3D Screening Mammogram, Left and Right; Future  Estrogen deficiency -     DG Bone Density; Future  Adjustment disorder with mixed anxiety and depressed mood Assessment & Plan: No concerns today. Continue to monitor.    Hypertriglyceridemia Assessment & Plan: Discussed the importance of a healthy diet and regular exercise in order for weight loss, and to reduce the risk of further co-morbidity.  Repeat lipid panel pending.   Orders: -     Lipid panel -     Comprehensive metabolic panel -     TSH  Prediabetes Assessment & Plan: Reviewed A1C from July 2023 through [facility].  Repeat A1C pending.   Orders: -     Hemoglobin A1c -     TSH  Chronic hip pain, right Assessment & Plan: Waxes and wanes, currently uncontrolled.  Trial of Meloxicam 7.5 mg PRN provided. Referral placed to physical therapy. She will update.   Orders: -     Meloxicam; Take 1 tablet (7.5 mg total) by mouth daily as needed for pain.  Dispense: 90 tablet; Refill: 0 -     Ambulatory referral to Physical Therapy  Chronic midline low back pain  without sciatica Assessment & Plan: Strongly advise she increase physical activity.  Referral placed to physical therapy. Start meloxicam 7.5 mg as needed.  Orders: -     Meloxicam; Take 1 tablet (7.5 mg total) by mouth daily as needed for pain.  Dispense: 90 tablet; Refill: 0 -     Ambulatory referral to Physical Therapy  Localized skin mass, lump, or swelling Assessment & Plan: Exam today representative of benign cyst. X-ray of the right  foot ordered and pending.  She will also follow-up with dermatology next month.  Orders: -     DG Foot Complete Right        Pleas Koch, NP

## 2022-10-12 NOTE — Assessment & Plan Note (Addendum)
Waxes and wanes, currently uncontrolled.  Trial of Meloxicam 7.5 mg PRN provided. Referral placed to physical therapy. She will update.

## 2022-10-13 NOTE — Telephone Encounter (Signed)
See result note for further documentation.

## 2022-10-14 ENCOUNTER — Other Ambulatory Visit (INDEPENDENT_AMBULATORY_CARE_PROVIDER_SITE_OTHER): Payer: Medicare Other

## 2022-10-14 DIAGNOSIS — E875 Hyperkalemia: Secondary | ICD-10-CM | POA: Diagnosis not present

## 2022-10-14 LAB — POTASSIUM: Potassium: 4.7 mEq/L (ref 3.5–5.1)

## 2022-10-18 DIAGNOSIS — Z6832 Body mass index (BMI) 32.0-32.9, adult: Secondary | ICD-10-CM

## 2022-10-18 NOTE — Telephone Encounter (Signed)
Letter was sent regarding PT referral.

## 2022-11-10 ENCOUNTER — Encounter: Payer: Self-pay | Admitting: Dietician

## 2022-11-10 ENCOUNTER — Encounter: Payer: Medicare Other | Attending: Primary Care | Admitting: Dietician

## 2022-11-10 VITALS — Ht 65.0 in | Wt 197.0 lb

## 2022-11-10 DIAGNOSIS — M545 Low back pain, unspecified: Secondary | ICD-10-CM

## 2022-11-10 DIAGNOSIS — M549 Dorsalgia, unspecified: Secondary | ICD-10-CM | POA: Diagnosis not present

## 2022-11-10 DIAGNOSIS — Z6832 Body mass index (BMI) 32.0-32.9, adult: Secondary | ICD-10-CM | POA: Insufficient documentation

## 2022-11-10 DIAGNOSIS — M25569 Pain in unspecified knee: Secondary | ICD-10-CM | POA: Diagnosis not present

## 2022-11-10 DIAGNOSIS — Z713 Dietary counseling and surveillance: Secondary | ICD-10-CM | POA: Diagnosis not present

## 2022-11-10 DIAGNOSIS — E6609 Other obesity due to excess calories: Secondary | ICD-10-CM | POA: Insufficient documentation

## 2022-11-10 DIAGNOSIS — E669 Obesity, unspecified: Secondary | ICD-10-CM | POA: Insufficient documentation

## 2022-11-10 DIAGNOSIS — F32A Depression, unspecified: Secondary | ICD-10-CM | POA: Diagnosis not present

## 2022-11-10 DIAGNOSIS — G8929 Other chronic pain: Secondary | ICD-10-CM

## 2022-11-10 NOTE — Progress Notes (Signed)
Medical Nutrition Therapy: Visit start time: [id number]  end time: 1040  Assessment:   Referral Diagnosis: obesity Other medical history/ diagnoses: back and knee pain Psychosocial issues/ stress concerns: history of anxiety, depression  Medications, supplements: none at this time   Preferred learning method:  No preference indicated   Current weight: 197lbs Height: 5'5" BMI: 32.78 Patient's personal weight goal: 145lbs, short term 180lbs  Progress and evaluation:  Has tried multiple diets in the past with limited/ short term success Began losing weight in January 2024 with diet changes including vitamin supplements, high veg intake; then labwork results showed high potassium; retest later and was normal Went to [facility] several years ago and was advised to take weight loss meds, which was not what she wanted; also [facility] years [facility], lost some but soon regained weight. Using MyFitnessPal and aims for 1200kcal, soemtimes is below goal Lowest recent weight was 188 1-2 years ago, regained and more after cross-country trip Fam hx of diabetes and heart disease.  Social, lifestyle events often interfere with diet plans which then leads to regaining weight Food allergies: none Special diet practices: none Patient seeks help with weight loss to improve back and knee pain and reduce health risk    Dietary Intake:  Usual eating pattern includes 3 meals and 2 snacks per day. Dining out frequency: 0-1 meals per week. Who plans meals/ buys groceries? self Who prepares meals? self  Breakfast: smoothie with protein powder sometimes) or [place] yogurt; apple, banana, cherries + spinach + flaxseed 200-300kcal; or choc protein with powdered peanut butter and banana; occ cereal Snack: 1/2 apple; almonds; lowfat cheese = almonds Lunch: [place] spaghetti sauce with zoodles; baked sweet potato and lean meat Snack: nuts; nonfat Greek yogurt with fruit Supper: salad, sweet  potato, dried beans, greens, salmon/ chicken/ bison Snack: sm portion dark choc; fruit and yogurt; occ cookie or biscotti Beverages: water, sugar free drinks, 1c (large) coffee in am; occ red wine  Physical activity: no regular activity; enjoys painting   Intervention:   Nutrition Care Education:   Basic nutrition: appropriate nutrient balance; appropriate meal and snack schedule; general nutrition guidelines    Weight control: importance of low sugar and low fat choices; healthy portion sizes; estimated energy needs for weight loss at 1200 kcal, provided guidance for 40% CHO, 30% protein, 30% fat; importance of physical activity in promoting healthy metabolism; tracking food intake Advanced nutrition:  plant based eating patterns, DASH, Mediterranean (patient has investigated these patterns)  Other intervention notes: Patient seems to be following healthy and appropriate eating pattern and is motivated to continue. Increasing physical activity will likely have the most impact on ability to lose weight and maintain/ improve metabolism Established goals with input from patient. She will plan to schedule follow up in several weeks.   Nutritional Diagnosis:  [place]-3.3 Overweight/obesity As related to on-and-off dieting history, inadequate physical activity.  As evidenced by patient with current BMI of 32.78.   Education Materials given:  Museum/gallery conservator with food lists, sample meal pattern Visit summary with goals/ instructions   Learner/ who was taught:  Patient   Level of understanding: Verbalizes/ demonstrates competency  Demonstrated degree of understanding via:   Teach back Learning barriers: None  Willingness to learn/ readiness for change: Eager, change in progress   Monitoring and Evaluation:  Dietary intake, exercise, and body weight      follow up: in 4 week(s) approximately

## 2022-11-10 NOTE — Patient Instructions (Signed)
Continue to aim for 1200 calories daily, allowing for some flexibility Gradually build up physical activity by increasing daily steps, incorporating some light cardio exercise, and/or including some strength building exercises like lunges, wall push-ups, squats, others. Check FitOn app/ website for plenty of exercise ideas Great job making healthy food and beverage choices, keep it up!

## 2023-01-07 ENCOUNTER — Other Ambulatory Visit: Payer: Self-pay | Admitting: Primary Care

## 2023-01-07 DIAGNOSIS — G8929 Other chronic pain: Secondary | ICD-10-CM

## 2023-01-07 DIAGNOSIS — M545 Low back pain, unspecified: Secondary | ICD-10-CM

## 2023-01-10 DIAGNOSIS — M47817 Spondylosis without myelopathy or radiculopathy, lumbosacral region: Secondary | ICD-10-CM | POA: Diagnosis not present

## 2023-01-11 DIAGNOSIS — M47817 Spondylosis without myelopathy or radiculopathy, lumbosacral region: Secondary | ICD-10-CM | POA: Diagnosis not present

## 2023-01-12 DIAGNOSIS — Z83719 Family history of colon polyps, unspecified: Secondary | ICD-10-CM | POA: Diagnosis not present

## 2023-01-12 DIAGNOSIS — K219 Gastro-esophageal reflux disease without esophagitis: Secondary | ICD-10-CM | POA: Diagnosis not present

## 2023-01-12 DIAGNOSIS — D131 Benign neoplasm of stomach: Secondary | ICD-10-CM | POA: Diagnosis not present

## 2023-01-17 DIAGNOSIS — M47817 Spondylosis without myelopathy or radiculopathy, lumbosacral region: Secondary | ICD-10-CM | POA: Diagnosis not present

## 2023-01-25 DIAGNOSIS — M545 Low back pain, unspecified: Secondary | ICD-10-CM | POA: Diagnosis not present

## 2023-01-25 DIAGNOSIS — M25559 Pain in unspecified hip: Secondary | ICD-10-CM | POA: Diagnosis not present

## 2023-01-31 DIAGNOSIS — M545 Low back pain, unspecified: Secondary | ICD-10-CM | POA: Diagnosis not present

## 2023-01-31 DIAGNOSIS — M25559 Pain in unspecified hip: Secondary | ICD-10-CM | POA: Diagnosis not present

## 2023-02-02 DIAGNOSIS — M25559 Pain in unspecified hip: Secondary | ICD-10-CM | POA: Diagnosis not present

## 2023-02-02 DIAGNOSIS — M545 Low back pain, unspecified: Secondary | ICD-10-CM | POA: Diagnosis not present

## 2023-02-14 DIAGNOSIS — M25559 Pain in unspecified hip: Secondary | ICD-10-CM | POA: Diagnosis not present

## 2023-02-14 DIAGNOSIS — M545 Low back pain, unspecified: Secondary | ICD-10-CM | POA: Diagnosis not present

## 2023-02-26 DIAGNOSIS — M545 Low back pain, unspecified: Secondary | ICD-10-CM | POA: Diagnosis not present

## 2023-02-26 DIAGNOSIS — M25559 Pain in unspecified hip: Secondary | ICD-10-CM | POA: Diagnosis not present

## 2023-05-17 ENCOUNTER — Ambulatory Visit: Admission: RE | Admit: 2023-05-17 | Payer: Medicare Other | Source: Ambulatory Visit

## 2023-05-17 ENCOUNTER — Ambulatory Visit
Admission: RE | Admit: 2023-05-17 | Discharge: 2023-05-17 | Disposition: A | Discharge status: Home / Self Care | Payer: Medicare Other | Source: Ambulatory Visit | Attending: Primary Care | Admitting: Primary Care

## 2023-05-17 DIAGNOSIS — Z1231 Encounter for screening mammogram for malignant neoplasm of breast: Secondary | ICD-10-CM

## 2023-05-17 DIAGNOSIS — E2839 Other primary ovarian failure: Secondary | ICD-10-CM | POA: Insufficient documentation

## 2023-05-17 DIAGNOSIS — M8589 Other specified disorders of bone density and structure, multiple sites: Secondary | ICD-10-CM | POA: Diagnosis not present

## 2023-05-18 ENCOUNTER — Inpatient Hospital Stay
Admission: RE | Admit: 2023-05-18 | Discharge: 2023-05-18 | Disposition: A | Discharge status: Home / Self Care | Payer: Self-pay | Source: Ambulatory Visit | Attending: Primary Care | Admitting: Primary Care

## 2023-05-18 ENCOUNTER — Other Ambulatory Visit: Payer: Self-pay | Admitting: *Deleted

## 2023-05-18 DIAGNOSIS — Z1231 Encounter for screening mammogram for malignant neoplasm of breast: Secondary | ICD-10-CM

## 2023-05-23 DIAGNOSIS — H35363 Drusen (degenerative) of macula, bilateral: Secondary | ICD-10-CM | POA: Diagnosis not present

## 2023-05-23 DIAGNOSIS — H52223 Regular astigmatism, bilateral: Secondary | ICD-10-CM | POA: Diagnosis not present

## 2023-05-23 DIAGNOSIS — H524 Presbyopia: Secondary | ICD-10-CM | POA: Diagnosis not present

## 2023-05-23 DIAGNOSIS — H5203 Hypermetropia, bilateral: Secondary | ICD-10-CM | POA: Diagnosis not present

## 2023-07-26 ENCOUNTER — Encounter: Payer: Self-pay | Admitting: Primary Care

## 2023-07-26 ENCOUNTER — Ambulatory Visit: Payer: Medicare Other | Admitting: Primary Care

## 2023-07-26 VITALS — BP 130/78 | HR 76 | Temp 97.8°F | Ht 65.0 in

## 2023-07-26 DIAGNOSIS — M25512 Pain in left shoulder: Secondary | ICD-10-CM | POA: Diagnosis not present

## 2023-07-26 DIAGNOSIS — R053 Chronic cough: Secondary | ICD-10-CM | POA: Insufficient documentation

## 2023-07-26 NOTE — Assessment & Plan Note (Signed)
Symptoms representative of arthritis.  Fortunately, she has great range of motion. HPI and exam not suggestive of rotator cuff injury.  She has meloxicam at home and will start taking 7.5 mg daily with food for a brief period of time. I offered Toradol IM 60 mg today for which she declines.  Return precautions provided.

## 2023-07-26 NOTE — Patient Instructions (Signed)
Start famotidine (Pepcid) 20 mg every evening with dinner for your cough/reflux.  You may take meloxicam 7.5 mg once daily with a meal as needed for shoulder pain.  Make sure to take your Pepcid while taking meloxicam.  Please update me regarding your cough and your shoulder!  It was a pleasure to see you today!

## 2023-07-26 NOTE — Assessment & Plan Note (Signed)
Differentials include silent reflux, allergies, lung disease.  Respiratory exam benign.  Start famotidine 20 mg every evening with dinner. Consider PPI if needed.  She will update.

## 2023-07-26 NOTE — Progress Notes (Signed)
Subjective:    Patient ID: Lauren Callahan, female    DOB: 1956-07-17, 67 y.o.   MRN: [id number]  Cough Pertinent negatives include no chest pain, chills, fever, postnasal drip, rhinorrhea, sore throat or shortness of breath.  Shoulder Pain  Pertinent negatives include no fever.    Lauren Callahan is a very pleasant 67 y.o. female with a history of obesity, palpitations, prediabetes, anxiety, chronic back pain who presents today to discuss multiple concerns.  1) Cough: Acute for the last 1-2 months, intermittent, occurs with talking and during the night. The cough if felt to the upper chest. 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 Otherwise, nothing else has changed.  She's also noticed mid chest burning with radiation to the sides and upward. She has a history of acid reflux, has undergone upper endoscopy which did confirm acid reflux.  She has taken Pepcid in the  past on occasion with improvement.  She denies post nasal drip, fevers, chills, smoking, prior history of asthma. She doesn't feel sick.   She's been taking Claritin intermittently without improvement.   2) Shoulder Pain: Acute to the left shoulder for which began about 2 months ago. She describes her pain as burning and aching. Intermittent. She is right hand dominant. She's also noticed left humeral region.   She denies numbness/tingling, weakness, reduction in ROM, injury/trauma, repetitive movement of the left upper extremity. She is an Tree surgeon.     Review of Systems  Constitutional:  Negative for chills, fatigue and fever.  HENT:  Negative for congestion, postnasal drip, rhinorrhea and sore throat.   Respiratory:  Positive for cough. Negative for chest tightness and shortness of breath.   Cardiovascular:  Negative for chest pain.  Gastrointestinal:        Chest burning  Musculoskeletal:  Positive for arthralgias.         Past Medical History:  Diagnosis Date    Benign fundic gland polyps of stomach    Clotting disorder (HCC)    left leg   COVID-19 virus infection 04/28/2021   Depression    Endocervical polyp    Endometrial polyp    Endometriosis    Fibroid    Gastric polyp    Dr. Markham Jordan   GERD (gastroesophageal reflux disease)    History of gastritis 01/17/2019   Menorrhagia    Tongue lesion 11/06/2015    Social History   Socioeconomic History   Marital status: Married    Spouse name: Not on file   Number of children: Not on file   Years of education: Not on file   Highest education level: Not on file  Occupational History   Not on file  Tobacco Use   Smoking status: Never   Smokeless tobacco: Never  Vaping Use   Vaping status: Never Used  Substance and Sexual Activity   Alcohol use: Yes    Alcohol/week: 3.0 standard drinks of alcohol    Types: 3 Glasses of wine per week    Comment: occassionally   Drug use: No   Sexual activity: Yes    Birth control/protection: Surgical  Other Topics Concern   Not on file  Social History Narrative   Lives in [place] with husband.      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      Diet - regular      Exercise - limited   Social Determinants of Corporate investment banker Strain: Low  Risk  (07/20/2023)   Overall Financial Resource Strain (CARDIA)    Difficulty of Paying Living Expenses: Not hard at all  Food Insecurity: No Food Insecurity (07/20/2023)   Hunger Vital Sign    Worried About Running Out of Food in the Last Year: Never true    Ran Out of Food in the Last Year: Never true  Transportation Needs: No Transportation Needs (07/20/2023)   PRAPARE - Administrator, Civil Service (Medical): No    Lack of Transportation (Non-Medical): No  Physical Activity: Inactive (07/20/2023)   Exercise Vital Sign    Days of Exercise per Week: 0 days    Minutes of Exercise per Session: 0 min  Stress: No Stress Concern Present (07/20/2023)   [employer] of  Occupational Health - Occupational Stress Questionnaire    Feeling of Stress : Only a little  Social Connections: Socially Integrated (07/20/2023)   Social Connection and Isolation Panel [NHANES]    Frequency of Communication with Friends and Family: More than three times a week    Frequency of Social Gatherings with Friends and Family: Three times a week    Attends Religious Services: 1 to 4 times per year    Active Member of Clubs or Organizations: No    Attends Banker Meetings: 1 to 4 times per year    Marital Status: Married  Catering manager Violence: Not At Risk (10/04/2022)   Humiliation, Afraid, Rape, and Kick questionnaire    Fear of Current or Ex-Partner: No    Emotionally Abused: No    Physically Abused: No    Sexually Abused: No    Past Surgical History:  Procedure Laterality Date   BREAST BIOPSY     " needle bx" done unknown side ? 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 Chestine Spore   TUBAL LIGATION     VAGINAL HYSTERECTOMY  09/13/1999   LAVH  Left Salpingectomy    Family History  Problem Relation Age of Onset   Diabetes Mother    Diabetes Father    Hypertension Father    Heart disease Father        heart attacks    Heart attack Father    Breast cancer Paternal Aunt    Diabetes Maternal Grandfather     Allergies  Allergen Reactions   Macrodantin [Nitrofurantoin Macrocrystal] Itching and Rash    Current Outpatient Medications on File Prior to Visit  Medication Sig Dispense Refill   fexofenadine (ALLEGRA) 180 MG tablet Take 180 mg by mouth as needed for allergies or rhinitis.     Multiple Vitamin (MULTIVITAMIN ADULT PO) Take by mouth.     Omega-3 Fatty Acids (OMEGA-3 EPA FISH OIL PO) Take by mouth.     meloxicam (MOBIC) 7.5 MG tablet TAKE 1 TABLET BY MOUTH DAILY AS NEEDED FOR PAIN (Patient not taking: Reported on 07/26/2023) 90 tablet 0   No current  facility-administered medications on file prior to visit.    BP 130/78   Pulse 76   Temp 97.8 F (36.6 C) (Temporal)   Ht 5\' 5"  (1.651 m)   SpO2 99%   BMI 32.78 kg/m  Objective:   Physical Exam Constitutional:      Appearance: She is not ill-appearing.  HENT:     Right Ear: Tympanic membrane and ear canal normal.     Left Ear: Tympanic membrane and  ear canal normal.     Nose: No mucosal edema.     Right Sinus: No maxillary sinus tenderness or frontal sinus tenderness.     Left Sinus: No maxillary sinus tenderness or frontal sinus tenderness.     Mouth/Throat:     Mouth: Mucous membranes are moist.  Eyes:     Conjunctiva/sclera: Conjunctivae normal.  Cardiovascular:     Rate and Rhythm: Normal rate and regular rhythm.  Pulmonary:     Effort: Pulmonary effort is normal.     Breath sounds: Normal breath sounds. No wheezing or rhonchi.  Musculoskeletal:     Left shoulder: No bony tenderness. Normal range of motion. Normal strength.     Cervical back: Neck supple.  Skin:    General: Skin is warm and dry.           Assessment & Plan:  Acute pain of left shoulder Assessment & Plan: Symptoms representative of arthritis.  Fortunately, she has great range of motion. HPI and exam not suggestive of rotator cuff injury.  She has meloxicam at home and will start taking 7.5 mg daily with food for a brief period of time. I offered Toradol IM 60 mg today for which she declines.  Return precautions provided.   Persistent cough for 3 weeks or longer Assessment & Plan: Differentials include silent reflux, allergies, lung disease.  Respiratory exam benign.  Start famotidine 20 mg every evening with dinner. Consider PPI if needed.  She will update.          Doreene Nest, NP

## 2023-11-07 DIAGNOSIS — D225 Melanocytic nevi of trunk: Secondary | ICD-10-CM | POA: Diagnosis not present

## 2023-11-07 DIAGNOSIS — D2261 Melanocytic nevi of right upper limb, including shoulder: Secondary | ICD-10-CM | POA: Diagnosis not present

## 2023-11-07 DIAGNOSIS — D2262 Melanocytic nevi of left upper limb, including shoulder: Secondary | ICD-10-CM | POA: Diagnosis not present

## 2023-11-07 DIAGNOSIS — D2271 Melanocytic nevi of right lower limb, including hip: Secondary | ICD-10-CM | POA: Diagnosis not present

## 2023-11-07 DIAGNOSIS — D2272 Melanocytic nevi of left lower limb, including hip: Secondary | ICD-10-CM | POA: Diagnosis not present

## 2023-11-07 DIAGNOSIS — L814 Other melanin hyperpigmentation: Secondary | ICD-10-CM | POA: Diagnosis not present

## 2023-11-07 DIAGNOSIS — L708 Other acne: Secondary | ICD-10-CM | POA: Diagnosis not present

## 2023-11-07 DIAGNOSIS — D224 Melanocytic nevi of scalp and neck: Secondary | ICD-10-CM | POA: Diagnosis not present

## 2023-11-07 DIAGNOSIS — D2239 Melanocytic nevi of other parts of face: Secondary | ICD-10-CM | POA: Diagnosis not present

## 2024-01-02 ENCOUNTER — Encounter: Payer: Self-pay | Admitting: Internal Medicine

## 2024-01-02 ENCOUNTER — Ambulatory Visit (INDEPENDENT_AMBULATORY_CARE_PROVIDER_SITE_OTHER): Admitting: Internal Medicine

## 2024-01-02 VITALS — BP 112/70 | HR 86 | Temp 98.6°F | Ht 65.0 in

## 2024-01-02 DIAGNOSIS — M7061 Trochanteric bursitis, right hip: Secondary | ICD-10-CM | POA: Insufficient documentation

## 2024-01-02 DIAGNOSIS — U071 COVID-19: Secondary | ICD-10-CM

## 2024-01-02 NOTE — Assessment & Plan Note (Signed)
 Discussed ice Has the meloxicam  which helps--but acts up her heartburn Discussed that it would be reasonable to substitute ibuprofen 400-600mg  three times a day, or naproxen 440 twice a day as needed

## 2024-01-02 NOTE — Assessment & Plan Note (Signed)
 4 days out, fairly mild and may be some better today---so will hold off on paxlovid Supportive care--mostly rest and tylenol Discussed quarantine To ER if SOB

## 2024-01-02 NOTE — Progress Notes (Signed)
 Subjective:    Patient ID: Lauren Callahan, female    DOB: 02-07-1956, 68 y.o.   MRN: [id number]  HPI Here due to respiratory illness  Had left sided sore throat off and on for 4 weeks Felt it was allergies--so taking allegra Then 4 days ago--woke and it was severely painful Couldn't sleep--trouble swallowing Sweats, low grade fever, fatigue Nausea with vomiting once Some better today--able to drink fine. Appetite is off  Some cough--using tussin DM  --this helped No SOB  Not using the meloxicam  much--due to acid reflux (needs the pepcid then) Does have right hip pain--saw Dr Annabell Key years ago Now with hip pain, down lateral thigh and then medial calf  Current Outpatient Medications on File Prior to Visit  Medication Sig Dispense Refill   fexofenadine (ALLEGRA) 180 MG tablet Take 180 mg by mouth as needed for allergies or rhinitis.     meloxicam  (MOBIC ) 7.5 MG tablet TAKE 1 TABLET BY MOUTH DAILY AS NEEDED FOR PAIN 90 tablet 0   Omega-3 Fatty Acids (OMEGA-3 EPA FISH OIL PO) Take by mouth.     No current facility-administered medications on file prior to visit.    Allergies  Allergen Reactions   Macrodantin  [Nitrofurantoin  Macrocrystal] Itching and Rash    Past Medical History:  Diagnosis Date   Benign fundic gland polyps of stomach    Clotting disorder (HCC)    left leg   COVID-19 virus infection 04/28/2021   Depression    Endocervical polyp    Endometrial polyp    Endometriosis    Fibroid    Gastric polyp    Dr. Dawne Euler   GERD (gastroesophageal reflux disease)    History of gastritis 01/17/2019   Menorrhagia    Tongue lesion 11/06/2015    Past Surgical History:  Procedure Laterality Date   BREAST BIOPSY     " needle bx" done unknown side ? 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 Fulton Job   TUBAL LIGATION     VAGINAL HYSTERECTOMY  09/13/1999   LAVH  Left Salpingectomy     Family History  Problem Relation Age of Onset   Diabetes Mother    Diabetes Father    Hypertension Father    Heart disease Father        heart attacks    Heart attack Father    Breast cancer Paternal Aunt    Diabetes Maternal Grandfather     Social History   Socioeconomic History   Marital status: Married    Spouse name: Not on file   Number of children: Not on file   Years of education: Not on file   Highest education level: Not on file  Occupational History   Not on file  Tobacco Use   Smoking status: Never   Smokeless tobacco: Never  Vaping Use   Vaping status: Never Used  Substance and Sexual Activity   Alcohol use: Yes    Alcohol/week: 3.0 standard drinks of alcohol    Types: 3 Glasses of wine per week    Comment: occassionally   Drug use: No   Sexual activity: Yes    Birth control/protection: Surgical  Other Topics Concern   Not on file  Social History Narrative   Lives in [place] with husband.      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      Diet -  regular      Exercise - limited   Social Drivers of Health   Financial Resource Strain: Low Risk  (01/01/2024)   Overall Financial Resource Strain (CARDIA)    Difficulty of Paying Living Expenses: Not hard at all  Food Insecurity: No Food Insecurity (01/01/2024)   Hunger Vital Sign    Worried About Running Out of Food in the Last Year: Never true    Ran Out of Food in the Last Year: Never true  Transportation Needs: No Transportation Needs (01/01/2024)   PRAPARE - Administrator, Civil Service (Medical): No    Lack of Transportation (Non-Medical): No  Physical Activity: Unknown (01/01/2024)   Exercise Vital Sign    Days of Exercise per Week: 0 days    Minutes of Exercise per Session: Not on file  Stress: No Stress Concern Present (01/01/2024)   [employer] of Occupational Health - Occupational Stress Questionnaire    Feeling of Stress : Not at all  Social Connections:  Socially Integrated (01/01/2024)   Social Connection and Isolation Panel [NHANES]    Frequency of Communication with Friends and Family: More than three times a week    Frequency of Social Gatherings with Friends and Family: Once a week    Attends Religious Services: 1 to 4 times per year    Active Member of [employer] or Organizations: Yes    Attends Engineer, structural: More than 4 times per year    Marital Status: Married  Catering manager Violence: Not At Risk (10/04/2022)   Humiliation, Afraid, Rape, and Kick questionnaire    Fear of Current or Ex-Partner: No    Emotionally Abused: No    Physically Abused: No    Sexually Abused: No   Review of Systems      Objective:   Physical Exam Constitutional:      Appearance: Normal appearance.  HENT:     Head:     Comments: No sinus tenderness    Right Ear: Tympanic membrane and ear canal normal.     Left Ear: Tympanic membrane and ear canal normal.     Mouth/Throat:     Comments: Slight pharyngeal injection Pulmonary:     Effort: Pulmonary effort is normal.     Breath sounds: Normal breath sounds. No wheezing or rales.  Musculoskeletal:     Cervical back: Neck supple.     Comments: Normal ROM in right hip Tenderness over bursa on right  Lymphadenopathy:     Cervical: No cervical adenopathy.  Neurological:     Mental Status: She is alert.     Comments: Slightly antalgic gait            Assessment & Plan:

## 2024-02-15 DIAGNOSIS — G8929 Other chronic pain: Secondary | ICD-10-CM | POA: Diagnosis not present

## 2024-02-15 DIAGNOSIS — M25551 Pain in right hip: Secondary | ICD-10-CM | POA: Diagnosis not present

## 2024-03-08 DIAGNOSIS — R0683 Snoring: Secondary | ICD-10-CM | POA: Diagnosis not present

## 2024-03-08 DIAGNOSIS — J302 Other seasonal allergic rhinitis: Secondary | ICD-10-CM | POA: Diagnosis not present

## 2024-03-08 DIAGNOSIS — E781 Pure hyperglyceridemia: Secondary | ICD-10-CM | POA: Diagnosis not present

## 2024-03-08 DIAGNOSIS — Z7689 Persons encountering health services in other specified circumstances: Secondary | ICD-10-CM | POA: Diagnosis not present

## 2024-03-08 DIAGNOSIS — K635 Polyp of colon: Secondary | ICD-10-CM | POA: Diagnosis not present

## 2024-03-08 DIAGNOSIS — R400 Somnolence: Secondary | ICD-10-CM | POA: Diagnosis not present

## 2024-03-08 DIAGNOSIS — K317 Polyp of stomach and duodenum: Secondary | ICD-10-CM | POA: Diagnosis not present

## 2024-03-08 DIAGNOSIS — Z0000 Encounter for general adult medical examination without abnormal findings: Secondary | ICD-10-CM | POA: Diagnosis not present

## 2024-03-08 DIAGNOSIS — Z9109 Other allergy status, other than to drugs and biological substances: Secondary | ICD-10-CM | POA: Diagnosis not present

## 2024-03-08 DIAGNOSIS — R7309 Other abnormal glucose: Secondary | ICD-10-CM | POA: Diagnosis not present

## 2024-03-08 DIAGNOSIS — K219 Gastro-esophageal reflux disease without esophagitis: Secondary | ICD-10-CM | POA: Diagnosis not present

## 2024-03-08 DIAGNOSIS — R232 Flushing: Secondary | ICD-10-CM | POA: Diagnosis not present

## 2024-03-08 DIAGNOSIS — Z13220 Encounter for screening for lipoid disorders: Secondary | ICD-10-CM | POA: Diagnosis not present

## 2024-06-25 DIAGNOSIS — Z8719 Personal history of other diseases of the digestive system: Secondary | ICD-10-CM | POA: Diagnosis not present

## 2024-06-25 DIAGNOSIS — Z860100 Personal history of colon polyps, unspecified: Secondary | ICD-10-CM | POA: Diagnosis not present

## 2024-06-25 DIAGNOSIS — K219 Gastro-esophageal reflux disease without esophagitis: Secondary | ICD-10-CM | POA: Diagnosis not present

## 2024-07-31 ENCOUNTER — Encounter

## 2024-07-31 ENCOUNTER — Ambulatory Visit
Admission: RE | Admit: 2024-07-31 | Discharge: 2024-07-31 | Disposition: A | Discharge status: Home / Self Care | Attending: Gastroenterology | Admitting: Gastroenterology

## 2024-07-31 ENCOUNTER — Encounter: Payer: Self-pay | Admitting: Gastroenterology

## 2024-07-31 ENCOUNTER — Ambulatory Visit

## 2024-07-31 ENCOUNTER — Encounter
Admission: RE | Disposition: A | Discharge status: Home / Self Care | Payer: Self-pay | Source: Home / Self Care | Attending: Gastroenterology

## 2024-07-31 ENCOUNTER — Other Ambulatory Visit: Payer: Self-pay

## 2024-07-31 DIAGNOSIS — D125 Benign neoplasm of sigmoid colon: Secondary | ICD-10-CM | POA: Insufficient documentation

## 2024-07-31 DIAGNOSIS — D120 Benign neoplasm of cecum: Secondary | ICD-10-CM | POA: Diagnosis not present

## 2024-07-31 DIAGNOSIS — K5730 Diverticulosis of large intestine without perforation or abscess without bleeding: Secondary | ICD-10-CM | POA: Insufficient documentation

## 2024-07-31 DIAGNOSIS — K219 Gastro-esophageal reflux disease without esophagitis: Secondary | ICD-10-CM | POA: Insufficient documentation

## 2024-07-31 DIAGNOSIS — Z1211 Encounter for screening for malignant neoplasm of colon: Secondary | ICD-10-CM | POA: Diagnosis present

## 2024-07-31 DIAGNOSIS — D124 Benign neoplasm of descending colon: Secondary | ICD-10-CM | POA: Insufficient documentation

## 2024-07-31 DIAGNOSIS — K449 Diaphragmatic hernia without obstruction or gangrene: Secondary | ICD-10-CM | POA: Diagnosis not present

## 2024-07-31 DIAGNOSIS — K317 Polyp of stomach and duodenum: Secondary | ICD-10-CM | POA: Insufficient documentation

## 2024-07-31 DIAGNOSIS — K648 Other hemorrhoids: Secondary | ICD-10-CM | POA: Diagnosis not present

## 2024-07-31 HISTORY — PX: POLYPECTOMY: SHX149

## 2024-07-31 HISTORY — PX: ESOPHAGOGASTRODUODENOSCOPY: SHX5428

## 2024-07-31 HISTORY — PX: HEMOSTASIS CLIP PLACEMENT: SHX6857

## 2024-07-31 HISTORY — PX: COLONOSCOPY: SHX5424

## 2024-07-31 SURGERY — COLONOSCOPY
Anesthesia: General

## 2024-07-31 MED ORDER — LIDOCAINE HCL (CARDIAC) PF 100 MG/5ML IV SOSY
PREFILLED_SYRINGE | INTRAVENOUS | Status: DC | PRN
  Administered 2024-07-31: 40 mg via INTRAVENOUS

## 2024-07-31 MED ORDER — GLYCOPYRROLATE 0.2 MG/ML IJ SOLN
INTRAMUSCULAR | Status: AC
  Filled 2024-07-31: qty 2

## 2024-07-31 MED ORDER — SODIUM CHLORIDE 0.9 % IV SOLN: INTRAVENOUS | Status: DC

## 2024-07-31 MED ORDER — GLYCOPYRROLATE 0.2 MG/ML IJ SOLN
INTRAMUSCULAR | Status: AC
  Filled 2024-07-31: qty 1

## 2024-07-31 MED ORDER — GLYCOPYRROLATE 0.2 MG/ML IJ SOLN
INTRAMUSCULAR | Status: DC | PRN
  Administered 2024-07-31: .1 mg via INTRAVENOUS

## 2024-07-31 MED ORDER — ONDANSETRON HCL 4 MG/2ML IJ SOLN
INTRAMUSCULAR | Status: DC | PRN
  Administered 2024-07-31: 4 mg via INTRAVENOUS

## 2024-07-31 MED ORDER — ONDANSETRON HCL 4 MG/2ML IJ SOLN
INTRAMUSCULAR | Status: AC
  Filled 2024-07-31: qty 2

## 2024-07-31 MED ORDER — PROPOFOL 10 MG/ML IV BOLUS
INTRAVENOUS | Status: AC
  Filled 2024-07-31: qty 20

## 2024-07-31 MED ORDER — PROPOFOL 10 MG/ML IV BOLUS
INTRAVENOUS | Status: DC | PRN
  Administered 2024-07-31: 20 mg via INTRAVENOUS
  Administered 2024-07-31: 70 mg via INTRAVENOUS
  Administered 2024-07-31 (×2): 30 mg via INTRAVENOUS
  Administered 2024-07-31: 20 mg via INTRAVENOUS
  Administered 2024-07-31: 30 mg via INTRAVENOUS
  Administered 2024-07-31: 20 mg via INTRAVENOUS
  Administered 2024-07-31: 100 mg via INTRAVENOUS
  Administered 2024-07-31: 30 mg via INTRAVENOUS
  Administered 2024-07-31: 20 mg via INTRAVENOUS
  Administered 2024-07-31: 50 mg via INTRAVENOUS
  Administered 2024-07-31 (×3): 20 mg via INTRAVENOUS

## 2024-07-31 MED ORDER — ONDANSETRON HCL 4 MG/2ML IJ SOLN
INTRAMUSCULAR | Status: AC
  Filled 2024-07-31: qty 6

## 2024-07-31 NOTE — Transfer of Care (Signed)
 Immediate Anesthesia Transfer of Care Note  Patient: Lauren Callahan  Procedure(s) Performed: COLONOSCOPY EGD (ESOPHAGOGASTRODUODENOSCOPY) POLYPECTOMY, INTESTINE CONTROL OF HEMORRHAGE, GI TRACT, ENDOSCOPIC, BY CLIPPING OR OVERSEWING  Patient Location: PACU  Anesthesia Type:General  Level of Consciousness: sedated  Airway & Oxygen Therapy: Patient Spontanous Breathing  Post-op Assessment: Report given to RN and Post -op Vital signs reviewed and stable  Post vital signs: Reviewed and stable  Last Vitals:  Vitals Value Taken Time  BP    Temp    Pulse 87 07/31/24 10:08  Resp 20 07/31/24 10:08  SpO2 98 % 07/31/24 10:08  Vitals shown include unfiled device data.  Last Pain:  Vitals:   07/31/24 0907  TempSrc: Temporal  PainSc: 0-No pain         Complications: No notable events documented.

## 2024-07-31 NOTE — H&P (Signed)
 Ruel Kung , MD [address], [place], [place], [address] Phone: [phone] Fax: [phone]  Primary Care Physician:  Sadie Manna, MD   Pre-Procedure History & Physical: HPI:  Lauren Callahan is a 68 y.o. female is here for an endoscopy and colonoscopy    Past Medical History:  Diagnosis Date   Benign fundic gland polyps of stomach    Clotting disorder    left leg   COVID-19 virus infection 04/28/2021   Depression    Endocervical polyp    Endometrial polyp    Endometriosis    Fibroid    Gastric polyp    Dr. Geryl   GERD (gastroesophageal reflux disease)    History of gastritis 01/17/2019   Menorrhagia    Tongue lesion 11/06/2015    Past Surgical History:  Procedure Laterality Date   BREAST BIOPSY      needle bx done unknown side ? 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 Gretta   TUBAL LIGATION     VAGINAL HYSTERECTOMY  09/13/1999   LAVH  Left Salpingectomy    Prior to Admission medications   Medication Sig Start Date End Date Taking? Authorizing Provider  cyanocobalamin  (VITAMIN B12) 1000 MCG tablet Take 1,000 mcg by mouth daily.   Yes [provider]  fexofenadine (ALLEGRA) 180 MG tablet Take 180 mg by mouth as needed for allergies or rhinitis.   Yes [provider]  meloxicam  (MOBIC ) 7.5 MG tablet TAKE 1 TABLET BY MOUTH DAILY AS NEEDED FOR PAIN 01/07/23  Yes Clark, Katherine K, NP  Omega-3 Fatty Acids (OMEGA-3 EPA FISH OIL PO) Take by mouth.   Yes [provider]  omeprazole (PRILOSEC) 20 MG capsule Take 20 mg by mouth daily.   Yes [provider]  tirzepatide CLOYDE) 2.5 MG/0.5ML Pen Inject 2.5 mg into the skin once a week.   Yes [provider]  Vitamin D , Ergocalciferol , (DRISDOL) 1.25 MG (50000 UNIT) CAPS capsule Take 50,000 Units by mouth every 7 (seven) days.   Yes [provider]     Allergies as of 06/28/2024 - Review Complete 01/02/2024  Allergen Reaction Noted   Macrodantin  [nitrofurantoin  macrocrystal] Itching and Rash 08/28/2012    Family History  Problem Relation Age of Onset   Diabetes Mother    Diabetes Father    Hypertension Father    Heart disease Father        heart attacks    Heart attack Father    Breast cancer Paternal Aunt    Diabetes Maternal Grandfather     Social History   Socioeconomic History   Marital status: Married    Spouse name: Not on file   Number of children: Not on file   Years of education: Not on file   Highest education level: Not on file  Occupational History   Not on file  Tobacco Use   Smoking status: Never   Smokeless tobacco: Never  Vaping Use   Vaping status: Never Used  Substance and Sexual Activity   Alcohol use: Yes    Alcohol/week: 3.0 standard drinks of alcohol    Types: 3 Glasses of wine per week    Comment: occassionally   Drug use: No   Sexual activity: Yes    Birth control/protection: Surgical  Other Topics Concern   Not on file  Social History Narrative   Lives  in [place] with husband.      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   Overall Financial Resource Strain (CARDIA)    Difficulty of Paying Living Expenses: Patient declined  Food Insecurity: Patient Declined (06/25/2024)   Received from [facility]   Hunger Vital Sign    Within the past 12 months, you worried that your food would run out before you got the money to buy more.: Patient declined    Within the past 12 months, the food you bought just didn't last and you didn't have money to get more.: Patient declined  Transportation Needs: Patient Declined (06/25/2024)   Received from [facility]  - Transportation    In the past 12 months, has lack of transportation kept you from medical appointments or from getting medications?: Patient declined    Lack of Transportation (Non-Medical): Patient declined  Physical Activity: Unknown (01/01/2024)   Exercise Vital Sign    Days of Exercise per Week: 0 days    Minutes of Exercise per Session: Not on file  Stress: No Stress Concern Present (01/01/2024)   [employer] of Occupational Health - Occupational Stress Questionnaire    Feeling of Stress : Not at all  Social Connections: Socially Integrated (01/01/2024)   Social Connection and Isolation Panel    Frequency of Communication with Friends and Family: More than three times a week    Frequency of Social Gatherings with Friends and Family: Once a week    Attends Religious Services: 1 to 4 times per year    Active Member of [employer] or Organizations: Yes    Attends Banker Meetings: More than 4 times per year    Marital Status: Married  Catering Manager Violence: Not At Risk (10/04/2022)   Humiliation, Afraid, Rape, and Kick questionnaire    Fear of Current or Ex-Partner: No    Emotionally Abused: No    Physically Abused: No    Sexually Abused: No    Review of Systems: See HPI, otherwise negative ROS  Physical Exam: BP 136/79   Pulse 75   Temp (!) 96.7 F (35.9 C) (Temporal)   Resp 18   Ht 5' 5 (1.651 m)   Wt 83.5 kg   SpO2 100%   BMI 30.62 kg/m  General:   Alert,  pleasant and cooperative in NAD Head:  Normocephalic and atraumatic. Neck:  Supple; no masses or thyromegaly. Lungs:  Clear throughout to auscultation, normal respiratory effort.    Heart:  +S1, +S2, Regular rate and rhythm, No edema. Abdomen:  Soft, nontender and nondistended. Normal bowel sounds, without guarding, and without rebound.   Neurologic:  Alert and  oriented x4;  grossly normal neurologically.  Impression/Plan: Lauren Callahan is here for an endoscopy and colonoscopy  to be performed  for  evaluation of gastric and colon polyps     Risks, benefits, limitations, and alternatives regarding endoscopy have been reviewed with the patient.  Questions have been answered.  All parties agreeable.   Ruel Kung, MD  07/31/2024, 9:21 AM

## 2024-07-31 NOTE — Op Note (Signed)
 [facility]astroenterology Patient Name: Lauren Callahan Procedure Date: 07/31/2024 9:17 AM MRN: [id number] Account #: [id number] Date of Birth: Sep 01, 1956 Admit Type: Outpatient Age: 68 Room: [facility] ENDO ROOM 3 Gender: Female Note Status: Finalized Instrument Name: Endoscope [id number] Procedure:             Upper GI endoscopy Indications:           Follow-up of gastric polyps Providers:             Ruel Kung MD, MD Referring MD:          Tamra Leventhal, MD (Referring MD) Medicines:             Monitored Anesthesia Care Complications:         No immediate complications. Procedure:             Pre-Anesthesia Assessment:                        - Prior to the procedure, a History and Physical was                         performed, and patient medications, allergies and                         sensitivities were reviewed. The patient's tolerance                         of previous anesthesia was reviewed.                        - The risks and benefits of the procedure and the                         sedation options and risks were discussed with the                         patient. All questions were answered and informed                         consent was obtained.                        - ASA Grade Assessment: II - A patient with mild                         systemic disease.                        After obtaining informed consent, the endoscope was                         passed under direct vision. Throughout the procedure,                         the patient's blood pressure, pulse, and oxygen                         saturations were monitored continuously. The Endoscope                         was  introduced through the mouth, and advanced to the                         third part of duodenum. The upper GI endoscopy was                         accomplished with ease. The patient tolerated the                         procedure well. Findings:      A  medium-sized hiatal hernia was present.      The esophagus was normal.      The examined duodenum was normal.      The stomach was normal.      The cardia and gastric fundus were normal on retroflexion. Impression:            - Medium-sized hiatal hernia.                        - Normal esophagus.                        - Normal examined duodenum.                        - Normal stomach.                        - No specimens collected. Recommendation:        - Perform a colonoscopy today. Procedure Code(s):     --- Professional ---                        [phone], Esophagogastroduodenoscopy, flexible,                         transoral; diagnostic, including collection of                         specimen(s) by brushing or washing, when performed                         (separate procedure) Diagnosis Code(s):     --- Professional ---                        K44.9, Diaphragmatic hernia without obstruction or                         gangrene                        K31.7, Polyp of stomach and duodenum CPT copyright 2022 American Medical Association. All rights reserved. The codes documented in this report are preliminary and upon coder review may  be revised to meet current compliance requirements. Ruel Kung, MD Ruel Kung MD, MD 07/31/2024 9:37:48 AM This report has been signed electronically. Number of Addenda: 0 Note Initiated On: 07/31/2024 9:17 AM Estimated Blood Loss:  Estimated blood loss: none.      [facility]

## 2024-07-31 NOTE — Op Note (Signed)
 [facility] Gastroenterology Patient Name: Lauren Callahan Procedure Date: 07/31/2024 9:16 AM MRN: [id number] Account #: [id number] Date of Birth: [birth year] Admit Type: Outpatient Age: 68 Room: [facility] ENDO ROOM 3 Gender: Female Note Status: Finalized Instrument Name: Colon Scope [id number] Procedure:             Colonoscopy Indications:           Surveillance: Personal history of colonic polyps                         (unknown histology) on last colonoscopy more than 3                         years ago Providers:             Ruel Kung MD, MD Referring MD:          Tamra Leventhal, MD (Referring MD) Medicines:             Monitored Anesthesia Care Complications:         No immediate complications. Procedure:             Pre-Anesthesia Assessment:                        - Prior to the procedure, a History and Physical was                         performed, and patient medications, allergies and                         sensitivities were reviewed. The patient's tolerance                         of previous anesthesia was reviewed.                        - The risks and benefits of the procedure and the                         sedation options and risks were discussed with the                         patient. All questions were answered and informed                         consent was obtained.                        - ASA Grade Assessment: II - A patient with mild                         systemic disease.                        After obtaining informed consent, the colonoscope was                         passed under direct vision. Throughout the procedure,                         the patient's blood  pressure, pulse, and oxygen                         saturations were monitored continuously. The                         Colonoscope was introduced through the anus and                         advanced to the the cecum, identified by the                         appendiceal  orifice. The colonoscopy was performed                         with ease. The patient tolerated the procedure well.                         The quality of the bowel preparation was excellent.                         The ileocecal valve, appendiceal orifice, and rectum                         were photographed. Findings:      The perianal and digital rectal examinations were normal.      A 10 mm polyp was found in the sigmoid colon. The polyp was       semi-pedunculated. The polyp was removed with a hot snare. Resection and       retrieval were complete.      A 5 mm polyp was found in the cecum. The polyp was sessile. The polyp       was removed with a jumbo cold forceps. Resection and retrieval were       complete.      A 20 mm polyp was found in the sigmoid colon. The polyp was       pedunculated. The polyp was removed with a hot snare. Resection and       retrieval were complete. To prevent bleeding post-intervention, one       hemostatic clip was successfully placed. Clip manufacturer: [employer]. There was no bleeding at the end of the procedure.      Multiple medium-mouthed diverticula were found in the sigmoid colon.      A 10 mm polyp was found in the descending colon. The polyp was sessile.       The polyp was removed with a cold snare. Resection and retrieval were       complete.      Non-bleeding internal hemorrhoids were found during retroflexion. The       hemorrhoids were medium-sized.      The exam was otherwise without abnormality on direct and retroflexion       views. Impression:            - One 10 mm polyp in the sigmoid colon, removed with a                         hot snare. Resected and retrieved.                        -  One 5 mm polyp in the cecum, removed with a jumbo                         cold forceps. Resected and retrieved.                        - One 20 mm polyp in the sigmoid colon, removed with a                         hot snare. Resected  and retrieved. Clip was placed.                         Clip manufacturer: [employer].                        - Diverticulosis in the sigmoid colon.                        - One 10 mm polyp in the descending colon, removed                         with a cold snare. Resected and retrieved.                        - Non-bleeding internal hemorrhoids.                        - The examination was otherwise normal on direct and                         retroflexion views. Recommendation:        - Discharge patient to home (with escort).                        - Resume previous diet.                        - Continue present medications.                        - Await pathology results.                        - Repeat colonoscopy in 3 years for surveillance based                         on pathology results. Procedure Code(s):     --- Professional ---                        [phone], Colonoscopy, flexible; with removal of                         tumor(s), polyp(s), or other lesion(s) by snare                         technique                        45380, 59, Colonoscopy, flexible; with biopsy, single  or multiple Diagnosis Code(s):     --- Professional ---                        Z86.010, Personal history of colonic polyps                        D12.0, Benign neoplasm of cecum                        D12.5, Benign neoplasm of sigmoid colon                        D12.4, Benign neoplasm of descending colon                        K64.8, Other hemorrhoids                        K57.30, Diverticulosis of large intestine without                         perforation or abscess without bleeding CPT copyright 2022 American Medical Association. All rights reserved. The codes documented in this report are preliminary and upon coder review may  be revised to meet current compliance requirements. Ruel Kung, MD Ruel Kung MD, MD 07/31/2024 10:08:19 AM This report has been signed  electronically. Number of Addenda: 0 Note Initiated On: 07/31/2024 9:16 AM Scope Withdrawal Time: 0 hours 15 minutes 11 seconds  Total Procedure Duration: 0 hours 20 minutes 37 seconds  Estimated Blood Loss:  Estimated blood loss: none.      [facility]

## 2024-07-31 NOTE — Anesthesia Postprocedure Evaluation (Signed)
 Anesthesia Post Note  Patient: Lauren Callahan  Procedure(s) Performed: COLONOSCOPY EGD (ESOPHAGOGASTRODUODENOSCOPY) POLYPECTOMY, INTESTINE CONTROL OF HEMORRHAGE, GI TRACT, ENDOSCOPIC, BY CLIPPING OR OVERSEWING  Patient location during evaluation: Endoscopy Anesthesia Type: General Level of consciousness: awake and alert Pain management: pain level controlled Vital Signs Assessment: post-procedure vital signs reviewed and stable Respiratory status: spontaneous breathing, nonlabored ventilation and respiratory function stable Cardiovascular status: blood pressure returned to baseline and stable Postop Assessment: no apparent nausea or vomiting Anesthetic complications: no   No notable events documented.   Last Vitals:  Vitals:   07/31/24 1027 07/31/24 1032  BP: 116/75 119/76  Pulse: 78 78  Resp: 11   Temp:    SpO2: 100% 100%    Last Pain:  Vitals:   07/31/24 1027  TempSrc:   PainSc: 0-No pain                 Fairy POUR Christofer Shen

## 2024-07-31 NOTE — Anesthesia Preprocedure Evaluation (Signed)
 Anesthesia Evaluation  Patient identified by MRN, date of birth, ID band Patient awake    Reviewed: Allergy & Precautions, NPO status , Patient's Chart, lab work & pertinent test results  History of Anesthesia Complications Negative for: history of anesthetic complications  Airway Mallampati: III  TM Distance: <3 FB Neck ROM: full    Dental  (+) Chipped   Pulmonary neg pulmonary ROS, neg shortness of breath   Pulmonary exam normal        Cardiovascular Exercise Tolerance: Good (-) angina (-) Past MI negative cardio ROS Normal cardiovascular exam     Neuro/Psych negative neurological ROS     GI/Hepatic Neg liver ROS,GERD  Controlled,,  Endo/Other  negative endocrine ROS    Renal/GU negative Renal ROS  negative genitourinary   Musculoskeletal   Abdominal   Peds  Hematology negative hematology ROS (+)   Anesthesia Other Findings Past Medical History: No date: Benign fundic gland polyps of stomach No date: Clotting disorder     Comment:  left leg 04/28/2021: COVID-19 virus infection No date: Depression No date: Endocervical polyp No date: Endometrial polyp No date: Endometriosis No date: Fibroid No date: Gastric polyp     Comment:  Dr. Geryl No date: GERD (gastroesophageal reflux disease) 01/17/2019: History of gastritis No date: Menorrhagia 11/06/2015: Tongue lesion  Past Surgical History: No date: BREAST BIOPSY     Comment:   needle bx done unknown side ? 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 Gretta No date: TUBAL LIGATION 09/13/1999: VAGINAL HYSTERECTOMY     Comment:  [facility]  Left Salpingectomy  BMI    Body Mass Index: 30.62 kg/m      Reproductive/Obstetrics negative OB ROS                              Anesthesia Physical Anesthesia Plan  ASA:  2  Anesthesia Plan: General   Post-op Pain Management:    Induction: Intravenous  PONV Risk Score and Plan: Propofol  infusion and TIVA  Airway Management Planned: Natural Airway and Nasal Cannula  Additional Equipment:   Intra-op Plan:   Post-operative Plan:   Informed Consent: I have reviewed the patients History and Physical, chart, labs and discussed the procedure including the risks, benefits and alternatives for the proposed anesthesia with the patient or authorized representative who has indicated his/her understanding and acceptance.     Dental Advisory Given  Plan Discussed with: Anesthesiologist, CRNA and Surgeon  Anesthesia Plan Comments: (Patient consented for risks of anesthesia including but not limited to:  - adverse reactions to medications - risk of airway placement if required - damage to eyes, teeth, lips or other oral mucosa - nerve damage due to positioning  - sore throat or hoarseness - Damage to heart, brain, nerves, lungs, other parts of body or loss of life  Patient voiced understanding and assent.)        Anesthesia Quick Evaluation

## 2024-08-01 LAB — SURGICAL PATHOLOGY
